# Patient Record
Sex: Female | Born: 1965 | Race: Black or African American | Hispanic: No | Marital: Married | Smoking: Never smoker
Health system: Southern US, Community
[De-identification: ages and names within clinical notes are randomized; demographics above are authoritative.]

## PROBLEM LIST (undated history)

## (undated) DIAGNOSIS — E876 Hypokalemia: Secondary | ICD-10-CM

## (undated) DIAGNOSIS — I10 Essential (primary) hypertension: Secondary | ICD-10-CM

## (undated) HISTORY — PX: BREAST BIOPSY: SHX20

---

## 2017-05-21 ENCOUNTER — Other Ambulatory Visit: Payer: Self-pay | Admitting: Otolaryngology

## 2017-05-21 DIAGNOSIS — K118 Other diseases of salivary glands: Secondary | ICD-10-CM

## 2017-05-30 ENCOUNTER — Encounter: Payer: Self-pay | Admitting: Radiology

## 2017-05-30 ENCOUNTER — Ambulatory Visit
Admission: RE | Admit: 2017-05-30 | Discharge: 2017-05-30 | Disposition: A | Discharge status: Home / Self Care | Payer: 59 | Source: Ambulatory Visit | Attending: Otolaryngology | Admitting: Otolaryngology

## 2017-05-30 DIAGNOSIS — K118 Other diseases of salivary glands: Secondary | ICD-10-CM | POA: Insufficient documentation

## 2017-05-30 DIAGNOSIS — Z8589 Personal history of malignant neoplasm of other organs and systems: Secondary | ICD-10-CM | POA: Diagnosis not present

## 2017-05-30 DIAGNOSIS — R590 Localized enlarged lymph nodes: Secondary | ICD-10-CM | POA: Insufficient documentation

## 2017-05-30 MED ORDER — IOPAMIDOL (ISOVUE-300) INJECTION 61%
75.0000 mL | Freq: Once | INTRAVENOUS | Status: AC | PRN
  Administered 2017-05-30: 75 mL via INTRAVENOUS

## 2017-06-14 ENCOUNTER — Other Ambulatory Visit: Payer: Self-pay | Admitting: Physician Assistant

## 2017-06-14 DIAGNOSIS — Z1231 Encounter for screening mammogram for malignant neoplasm of breast: Secondary | ICD-10-CM

## 2017-07-09 ENCOUNTER — Ambulatory Visit
Admission: RE | Admit: 2017-07-09 | Discharge: 2017-07-09 | Disposition: A | Discharge status: Home / Self Care | Payer: 59 | Source: Ambulatory Visit | Attending: Physician Assistant | Admitting: Physician Assistant

## 2017-07-09 DIAGNOSIS — Z1231 Encounter for screening mammogram for malignant neoplasm of breast: Secondary | ICD-10-CM

## 2017-07-12 ENCOUNTER — Inpatient Hospital Stay
Admission: RE | Admit: 2017-07-12 | Discharge: 2017-07-12 | Disposition: A | Discharge status: Home / Self Care | Payer: Self-pay | Source: Ambulatory Visit | Attending: *Deleted | Admitting: *Deleted

## 2017-07-12 ENCOUNTER — Other Ambulatory Visit: Payer: Self-pay | Admitting: *Deleted

## 2017-07-12 DIAGNOSIS — Z9289 Personal history of other medical treatment: Secondary | ICD-10-CM

## 2017-07-24 ENCOUNTER — Other Ambulatory Visit: Payer: Self-pay | Admitting: Physician Assistant

## 2017-07-24 ENCOUNTER — Ambulatory Visit
Admission: RE | Admit: 2017-07-24 | Discharge: 2017-07-24 | Disposition: A | Discharge status: Home / Self Care | Payer: Self-pay | Source: Ambulatory Visit | Attending: Physician Assistant | Admitting: Physician Assistant

## 2017-07-24 DIAGNOSIS — N63 Unspecified lump in unspecified breast: Secondary | ICD-10-CM

## 2017-07-24 DIAGNOSIS — R9389 Abnormal findings on diagnostic imaging of other specified body structures: Secondary | ICD-10-CM

## 2017-08-09 ENCOUNTER — Other Ambulatory Visit: Payer: Self-pay

## 2017-08-09 DIAGNOSIS — Z1212 Encounter for screening for malignant neoplasm of rectum: Principal | ICD-10-CM

## 2017-08-09 DIAGNOSIS — Z1211 Encounter for screening for malignant neoplasm of colon: Secondary | ICD-10-CM

## 2017-08-09 NOTE — Progress Notes (Signed)
Gastroenterology Pre-Procedure Review  Request Date: 4/26 Requesting Physician: Dr. Maximino Greenlandahiliani  PATIENT REVIEW QUESTIONS: The patient responded to the following health history questions as indicated:    1. Are you having any GI issues? no 2. Do you have a personal history of Polyps? no 3. Do you have a family history of Colon Cancer or Polyps? no 4. Diabetes Mellitus? no 5. Joint replacements in the past 12 months?no 6. Major health problems in the past 3 months?no 7. Any artificial heart valves, MVP, or defibrillator?no    MEDICATIONS & ALLERGIES:    Patient reports the following regarding taking any anticoagulation/antiplatelet therapy:   Plavix, Coumadin, Eliquis, Xarelto, Lovenox, Pradaxa, Brilinta, or Effient? no Aspirin? no  Patient confirms/reports the following medications:  No current outpatient medications on file.   No current facility-administered medications for this visit.     Patient confirms/reports the following allergies:  No Known Allergies  No orders of the defined types were placed in this encounter.   AUTHORIZATION INFORMATION Primary Insurance: 1D#: Group #:  Secondary Insurance: 1D#: Group #:  SCHEDULE INFORMATION: Date: 4/26 Time: Location: [facility]

## 2017-08-12 ENCOUNTER — Telehealth: Payer: Self-pay | Admitting: Gastroenterology

## 2017-08-12 NOTE — Telephone Encounter (Signed)
Gastroenterology Pre-Procedure Review  Request Date:   Requesting Physician: Dr.    PATIENT REVIEW QUESTIONS: The patient responded to the following health history questions as indicated:    1. Are you having any GI issues? no 2. Do you have a personal history of Polyps? no 3. Do you have a family history of Colon Cancer or Polyps? no 4. Diabetes Mellitus? no 5. Joint replacements in the past 12 months?no 6. Major health problems in the past 3 months?no 7. Any artificial heart valves, MVP, or defibrillator?no    MEDICATIONS & ALLERGIES:    Patient reports the following regarding taking any anticoagulation/antiplatelet therapy:   Plavix, Coumadin, Eliquis, Xarelto, Lovenox, Pradaxa, Brilinta, or Effient? no Aspirin? no  Patient confirms/reports the following medications:  No current outpatient medications on file.   No current facility-administered medications for this visit.     Patient confirms/reports the following allergies:  No Known Allergies  No orders of the defined types were placed in this encounter.   AUTHORIZATION INFORMATION Primary Insurance: 1D#: Group #:  Secondary Insurance: 1D#: Group #:  SCHEDULE INFORMATION: Date:  Time: Location:

## 2017-08-14 ENCOUNTER — Ambulatory Visit: Payer: 59

## 2017-08-14 ENCOUNTER — Ambulatory Visit
Admission: RE | Admit: 2017-08-14 | Discharge: 2017-08-14 | Disposition: A | Discharge status: Home / Self Care | Payer: 59 | Source: Ambulatory Visit | Attending: Physician Assistant | Admitting: Physician Assistant

## 2017-08-14 DIAGNOSIS — N6322 Unspecified lump in the left breast, upper inner quadrant: Secondary | ICD-10-CM | POA: Insufficient documentation

## 2017-08-14 DIAGNOSIS — R9389 Abnormal findings on diagnostic imaging of other specified body structures: Secondary | ICD-10-CM

## 2017-08-14 DIAGNOSIS — R921 Mammographic calcification found on diagnostic imaging of breast: Secondary | ICD-10-CM | POA: Diagnosis not present

## 2017-11-19 ENCOUNTER — Telehealth: Payer: Self-pay

## 2017-11-19 NOTE — Telephone Encounter (Signed)
Left message that colonoscopy scheduled on 4/26 with Dr. Maximino Greenlandahiliani will need to reschedule. Doctor not available this day.

## 2017-11-25 ENCOUNTER — Telehealth: Payer: Self-pay | Admitting: Gastroenterology

## 2017-11-25 NOTE — Telephone Encounter (Signed)
Pt left vm to cancel procedure for 04/26 she states she just finished radiation and is not up for the procedure

## 2017-11-29 ENCOUNTER — Encounter: Admission: RE | Payer: Self-pay | Source: Ambulatory Visit

## 2017-11-29 ENCOUNTER — Ambulatory Visit: Admission: RE | Admit: 2017-11-29 | Payer: 59 | Source: Ambulatory Visit | Admitting: Gastroenterology

## 2017-11-29 SURGERY — COLONOSCOPY WITH PROPOFOL
Anesthesia: General

## 2017-11-29 NOTE — Telephone Encounter (Signed)
Pt has canceled procedure on 4/22 via vm because she had just finished radiation and was not up to procedure.

## 2018-02-07 ENCOUNTER — Observation Stay: Payer: 59

## 2018-02-07 ENCOUNTER — Encounter: Payer: Self-pay | Admitting: Emergency Medicine

## 2018-02-07 ENCOUNTER — Other Ambulatory Visit: Payer: Self-pay

## 2018-02-07 ENCOUNTER — Observation Stay
Admission: EM | Admit: 2018-02-07 | Discharge: 2018-02-09 | Disposition: A | Discharge status: Home / Self Care | Payer: 59 | Attending: Internal Medicine | Admitting: Internal Medicine

## 2018-02-07 DIAGNOSIS — E876 Hypokalemia: Principal | ICD-10-CM | POA: Insufficient documentation

## 2018-02-07 DIAGNOSIS — Z923 Personal history of irradiation: Secondary | ICD-10-CM | POA: Insufficient documentation

## 2018-02-07 DIAGNOSIS — E860 Dehydration: Secondary | ICD-10-CM | POA: Diagnosis not present

## 2018-02-07 DIAGNOSIS — R531 Weakness: Secondary | ICD-10-CM | POA: Diagnosis present

## 2018-02-07 DIAGNOSIS — Z8589 Personal history of malignant neoplasm of other organs and systems: Secondary | ICD-10-CM | POA: Insufficient documentation

## 2018-02-07 DIAGNOSIS — Z79899 Other long term (current) drug therapy: Secondary | ICD-10-CM | POA: Diagnosis not present

## 2018-02-07 DIAGNOSIS — G319 Degenerative disease of nervous system, unspecified: Secondary | ICD-10-CM | POA: Insufficient documentation

## 2018-02-07 DIAGNOSIS — R2681 Unsteadiness on feet: Secondary | ICD-10-CM | POA: Insufficient documentation

## 2018-02-07 LAB — BASIC METABOLIC PANEL
Anion gap: 23 — ABNORMAL HIGH (ref 5–15)
BUN: 29 mg/dL — AB (ref 6–20)
CHLORIDE: 90 mmol/L — AB (ref 98–111)
CO2: 30 mmol/L (ref 22–32)
Calcium: 10 mg/dL (ref 8.9–10.3)
Creatinine, Ser: 0.95 mg/dL (ref 0.44–1.00)
GFR calc Af Amer: >60 mL/min (ref >60)
GLUCOSE: 136 mg/dL — AB (ref 70–99)
POTASSIUM: 2.3 mmol/L — AB (ref 3.5–5.1)
Sodium: 143 mmol/L (ref 135–145)

## 2018-02-07 LAB — URINALYSIS, COMPLETE (UACMP) WITH MICROSCOPIC
Bilirubin Urine: NEGATIVE
Glucose, UA: NEGATIVE mg/dL
KETONES UR: 80 mg/dL — AB
Leukocytes, UA: NEGATIVE
Nitrite: NEGATIVE
PROTEIN: 30 mg/dL — AB
Specific Gravity, Urine: 1.027 (ref 1.005–1.030)
pH: 6 (ref 5.0–8.0)

## 2018-02-07 LAB — TROPONIN I: Troponin I: <0.03 ng/mL (ref <0.03)

## 2018-02-07 LAB — CBC
HEMATOCRIT: 45.7 % (ref 35.0–47.0)
Hemoglobin: 15.6 g/dL (ref 12.0–16.0)
MCH: 30.4 pg (ref 26.0–34.0)
MCHC: 34 g/dL (ref 32.0–36.0)
MCV: 89.4 fL (ref 80.0–100.0)
Platelets: 254 10*3/uL (ref 150–440)
RBC: 5.12 MIL/uL (ref 3.80–5.20)
RDW: 15.9 % — AB (ref 11.5–14.5)
WBC: 5.2 10*3/uL (ref 3.6–11.0)

## 2018-02-07 LAB — POTASSIUM
POTASSIUM: 2.7 mmol/L — AB (ref 3.5–5.1)
POTASSIUM: 3.6 mmol/L (ref 3.5–5.1)

## 2018-02-07 LAB — MAGNESIUM: Magnesium: 2.6 mg/dL — ABNORMAL HIGH (ref 1.7–2.4)

## 2018-02-07 LAB — CK: CK TOTAL: 69 U/L (ref 38–234)

## 2018-02-07 LAB — LACTIC ACID, PLASMA
LACTIC ACID, VENOUS: 3.1 mmol/L — AB (ref 0.5–1.9)
Lactic Acid, Venous: 2.2 mmol/L (ref 0.5–1.9)

## 2018-02-07 LAB — TSH: TSH: 0.515 u[IU]/mL (ref 0.350–4.500)

## 2018-02-07 MED ORDER — DOCUSATE SODIUM 100 MG PO CAPS
100.0000 mg | ORAL_CAPSULE | Freq: Two times a day (BID) | ORAL | Status: DC
  Administered 2018-02-08: 08:00:00 100 mg via ORAL
  Filled 2018-02-07 (×3): qty 1

## 2018-02-07 MED ORDER — ONDANSETRON HCL 4 MG PO TABS: 4.0000 mg | ORAL_TABLET | Freq: Four times a day (QID) | ORAL | Status: DC | PRN

## 2018-02-07 MED ORDER — HEPARIN SODIUM (PORCINE) 5000 UNIT/ML IJ SOLN
5000.0000 [IU] | Freq: Three times a day (TID) | INTRAMUSCULAR | Status: DC
  Administered 2018-02-08 – 2018-02-09 (×3): 5000 [IU] via SUBCUTANEOUS
  Filled 2018-02-07 (×4): qty 1

## 2018-02-07 MED ORDER — ONDANSETRON HCL 4 MG/2ML IJ SOLN
4.0000 mg | Freq: Four times a day (QID) | INTRAMUSCULAR | Status: DC | PRN
  Administered 2018-02-07 – 2018-02-08 (×2): 4 mg via INTRAVENOUS
  Filled 2018-02-07 (×2): qty 2

## 2018-02-07 MED ORDER — POTASSIUM CHLORIDE CRYS ER 20 MEQ PO TBCR
60.0000 meq | EXTENDED_RELEASE_TABLET | Freq: Once | ORAL | Status: AC
Start: 2018-02-07 — End: 2018-02-07
  Administered 2018-02-07: 16:00:00 60 meq via ORAL
  Filled 2018-02-07: qty 3

## 2018-02-07 MED ORDER — SODIUM CHLORIDE 0.9 % IV SOLN
INTRAVENOUS | Status: DC
  Administered 2018-02-07 (×2): via INTRAVENOUS

## 2018-02-07 MED ORDER — TRAZODONE HCL 50 MG PO TABS: 25.0000 mg | ORAL_TABLET | Freq: Every evening | ORAL | Status: DC | PRN

## 2018-02-07 MED ORDER — POTASSIUM CHLORIDE CRYS ER 20 MEQ PO TBCR
20.0000 meq | EXTENDED_RELEASE_TABLET | Freq: Once | ORAL | Status: AC
  Administered 2018-02-07: 20 meq via ORAL
  Filled 2018-02-07: qty 1

## 2018-02-07 MED ORDER — POTASSIUM CHLORIDE 20 MEQ PO PACK
60.0000 meq | PACK | Freq: Once | ORAL | Status: AC
  Administered 2018-02-07: 60 meq via ORAL
  Filled 2018-02-07: qty 3

## 2018-02-07 MED ORDER — SODIUM CHLORIDE 0.9 % IV BOLUS
1000.0000 mL | Freq: Once | INTRAVENOUS | Status: AC
  Administered 2018-02-07: 1000 mL via INTRAVENOUS

## 2018-02-07 MED ORDER — POTASSIUM CHLORIDE 10 MEQ/100ML IV SOLN
10.0000 meq | Freq: Once | INTRAVENOUS | Status: AC
  Administered 2018-02-07: 10 meq via INTRAVENOUS
  Filled 2018-02-07: qty 100

## 2018-02-07 MED ORDER — ACETAMINOPHEN 325 MG PO TABS: 650.0000 mg | ORAL_TABLET | Freq: Four times a day (QID) | ORAL | Status: DC | PRN

## 2018-02-07 MED ORDER — ACETAMINOPHEN 650 MG RE SUPP: 650.0000 mg | Freq: Four times a day (QID) | RECTAL | Status: DC | PRN

## 2018-02-07 MED ORDER — HYDROCODONE-ACETAMINOPHEN 5-325 MG PO TABS: 1.0000 | ORAL_TABLET | ORAL | Status: DC | PRN

## 2018-02-07 MED ORDER — BISACODYL 5 MG PO TBEC: 5.0000 mg | DELAYED_RELEASE_TABLET | Freq: Every day | ORAL | Status: DC | PRN

## 2018-02-07 MED ORDER — PIPERACILLIN-TAZOBACTAM 3.375 G IVPB
3.3750 g | Freq: Three times a day (TID) | INTRAVENOUS | Status: DC
  Administered 2018-02-07 – 2018-02-08 (×2): 3.375 g via INTRAVENOUS
  Filled 2018-02-07 (×2): qty 50

## 2018-02-07 NOTE — Progress Notes (Signed)
OT Cancellation Note  Patient Details Name: Robin Perkins MRN: [id number] DOB: 05-Jul-1966   Cancelled Treatment:    Reason Eval/Treat Not Completed: Medical issues which prohibited therapy. Order received, chart reviewed. Pt noted with K+ 2.3. Per therapy guidelines, will hold OT evaluation 2/2 to low K+ and re-attempt at later date/time as pt is medically appropriate.   Richrd PrimeJamie Stiller, MPH, MS, OTR/L ascom [phone] 02/07/18, 4:05 PM

## 2018-02-07 NOTE — ED Provider Notes (Signed)
[facility] Emergency Department Provider Note ____________________________________________   First MD Initiated Contact with Patient 02/07/18 1022     (approximate)  I have reviewed the triage vital signs and the nursing notes.   HISTORY  Chief Complaint Weakness    HPI Robin Perkins is a 52 y.o. 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  She states that she recently was diagnosed with dehydration and constipation and given a laxative which resulted in loose stools.  She does report some recent vomiting as well.  She denies any chest pain or difficulty breathing.  She states she is not taking any medications except for the laxative.   History reviewed. No pertinent past medical history.  Patient Active Problem List   Diagnosis Date Noted  . Hypokalemia 02/07/2018    Past Surgical History:  Procedure Laterality Date  . BREAST BIOPSY Bilateral     Prior to Admission medications   Medication Sig Start Date End Date Taking? Authorizing Provider  mineral oil enema Place 1 enema rectally as directed.   Yes [provider]  polyethylene glycol (MIRALAX / GLYCOLAX) packet Take 17 g by mouth daily.   Yes [provider]    Allergies Patient has no known allergies.  Family History  Problem Relation Age of Onset  . Breast cancer Sister 54    Social History Social History   Tobacco Use  . Smoking status: Never Smoker  . Smokeless tobacco: Never Used  Substance Use Topics  . Alcohol use: Never    Frequency: Never  . Drug use: Never    Review of Systems  Constitutional: Positive for generalized weakness.  No fever. Eyes: No visual changes. ENT: No sore  throat. Cardiovascular: Denies chest pain. Respiratory: Denies shortness of breath. Gastrointestinal: Positive for diarrhea. Genitourinary: Negative for dysuria.  Musculoskeletal: Negative for back pain. Skin: Negative for rash. Neurological: Negative for headache.   ____________________________________________   PHYSICAL EXAM:  VITAL SIGNS: ED Triage Vitals  Enc Vitals Group     BP 02/07/18 0935 (!) 123/97     Pulse Rate 02/07/18 0935 (!) 111     Resp 02/07/18 0935 18     Temp 02/07/18 0935 98 F (36.7 C)     Temp Source 02/07/18 0935 Oral     SpO2 02/07/18 0935 98 %     Weight 02/07/18 0936 140 lb (63.5 kg)     Height 02/07/18 0936 5\' 1"  (1.549 m)     Head Circumference --      Peak Flow --      Pain Score 02/07/18 0946 0     Pain Loc --      Pain Edu? --      Excl. in GC? --     Constitutional: Alert and oriented.  Weak appearing but in no acute distress. Eyes: Conjunctivae are normal.  EOMI.  PERRLA. Head: Atraumatic.  Right facial droop and surgical scars which patient states is chronic and unchanged. Nose: No congestion/rhinnorhea. Mouth/Throat: Mucous membranes are somewhat dry.   Neck: Normal range of motion.  Cardiovascular: Normal rate, regular rhythm. Grossly normal heart sounds.  Good peripheral circulation. Respiratory: Normal respiratory effort.  No retractions. Lungs CTAB. Gastrointestinal: Soft and nontender. No  distention.  Genitourinary: No flank tenderness. Musculoskeletal: No lower extremity edema.  Extremities warm and well perfused.  Neurologic:  Normal speech and language.  Motor intact in all extremities.  Except for chronic facial droop as above, no gross focal neurologic deficits are appreciated.  Skin:  Skin is warm and dry. No rash noted. Psychiatric: Somewhat flat affect.  ____________________________________________   LABS (all labs ordered are listed, but only abnormal results are displayed)  Labs Reviewed  BASIC METABOLIC PANEL -  Abnormal; Notable for the following components:      Result Value   Potassium 2.3 (*)    Chloride 90 (*)    Glucose, Bld 136 (*)    BUN 29 (*)    Anion gap 23 (*)    All other components within normal limits  CBC - Abnormal; Notable for the following components:   RDW 15.9 (*)    All other components within normal limits  URINALYSIS, COMPLETE (UACMP) WITH MICROSCOPIC - Abnormal; Notable for the following components:   Color, Urine AMBER (*)    APPearance HAZY (*)    Hgb urine dipstick SMALL (*)    Ketones, ur 80 (*)    Protein, ur 30 (*)    Bacteria, UA RARE (*)    All other components within normal limits  LACTIC ACID, PLASMA - Abnormal; Notable for the following components:   Lactic Acid, Venous 2.2 (*)    All other components within normal limits  MAGNESIUM - Abnormal; Notable for the following components:   Magnesium 2.6 (*)    All other components within normal limits  CK  TROPONIN I  LACTIC ACID, PLASMA  HIV ANTIBODY (ROUTINE TESTING)  TSH   ____________________________________________  EKG  ED ECG REPORT I, Dionne BucySebastian Theia Dezeeuw, the attending physician, personally viewed and interpreted this ECG.  Date: 02/07/2018 EKG Time: 1104 Rate: 103 Rhythm: Sinus tachycardia QRS Axis: normal Intervals: normal ST/T Wave abnormalities: LVH with inferior and lateral T wave inversions and borderline ST depression Narrative Interpretation: Nonspecific findings; no prior EKG available for comparison  ____________________________________________  RADIOLOGY    ____________________________________________   PROCEDURES  Procedure(s) performed: No  Procedures  Critical Care performed: No ____________________________________________   INITIAL IMPRESSION / ASSESSMENT AND PLAN / ED COURSE  Pertinent labs & imaging results that were available during my care of the patient were reviewed by me and considered in my medical decision making (see chart for  details).  52 year old female with PMH as noted above presents with generalized weakness over approximately the last week, with decreased appetite.  She does report some recent diarrhea after taking a laxative, and states that she was recently diagnosed with dehydration.  She is not on any other medications.  Initial labs reveal significant hypokalemia which likely could explain the patient's symptoms.  Differential also includes dehydration, anemia, rhabdomyolysis, or cardiac etiology.  We will obtain labs to rule out these causes, give fluids and replete potassium and admit.  ----------------------------------------- 3:48 PM on 02/07/2018 -----------------------------------------  The remainder the patient's work-up was relatively unremarkable.  I signed the patient out to the hospitalist Dr. Elisabeth PigeonVachhani. ____________________________________________   FINAL CLINICAL IMPRESSION(S) / ED DIAGNOSES  Final diagnoses:  Hypokalemia  Generalized weakness      NEW MEDICATIONS STARTED DURING THIS VISIT:  Current Discharge Medication List       Note:  This document was prepared using Dragon voice recognition software and may include unintentional dictation errors.    Dionne BucySiadecki, Nieshia Larmon, MD 02/07/18 1549

## 2018-02-07 NOTE — H&P (Signed)
[employer] [facility] [place] [facility]   PATIENT NAME: Robin Perkins    MR#:  [id number]  DATE OF BIRTH:  [birth year]  DATE OF ADMISSION:  02/07/2018  PRIMARY CARE PHYSICIAN: Patient, No Pcp Per   REQUESTING/REFERRING PHYSICIAN: Dionne Bucy, MD  CHIEF COMPLAINT:   Chief Complaint  Patient presents with  . Weakness    HISTORY OF PRESENT ILLNESS:  Robin Perkins  is a 52 y.o. 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 She completed postoperative radiation therapy on 11/20/17. She is followed by [facility] onco. She presents with generalized weakness over approximately the last week, worsening course, and associated with decreased appetite and inability to take care of herself. She was able to walk without any assistance before last week but not any more. She moved to stay with her uncle now.  She states that she recently was diagnosed with dehydration and constipation and given a laxative which resulted in loose stools. She states she is not taking any medications except for the laxative. PAST MEDICAL HISTORY:  History reviewed. No pertinent past medical history. Hepatitis Parotid acinic cell carcinoma PAST SURGICAL HISTORY:   Past Surgical History:  Procedure Laterality Date  . 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 Smoking status: Never Smoker  . Smokeless tobacco: Never Used  Substance Use Topics  . Alcohol use: Not on file    FAMILY HISTORY:   Family History  Problem Relation Age of Onset  . Breast cancer Sister 54    DRUG ALLERGIES:  No Known Allergies  REVIEW OF SYSTEMS:   Review of Systems  Constitutional: Positive for  malaise/fatigue. Negative for chills, fever and weight loss.  HENT: Negative for nosebleeds and sore throat.   Eyes: Negative for blurred vision.  Respiratory: Negative for cough, shortness of breath and wheezing.   Cardiovascular: Negative for chest pain, orthopnea, leg swelling and PND.  Gastrointestinal: Negative for abdominal pain, constipation, diarrhea, heartburn, nausea and vomiting.  Genitourinary: Negative for dysuria and urgency.  Musculoskeletal: Negative for back pain.  Skin: Negative for rash.  Neurological: Negative for dizziness, speech change, focal weakness and headaches.  Endo/Heme/Allergies: Does not bruise/bleed easily.  Psychiatric/Behavioral: Negative for depression.   MEDICATIONS AT HOME:   Prior to Admission medications   Medication Sig Start Date End Date Taking? Authorizing Provider  mineral oil enema Place 1 enema rectally as directed.   Yes [provider]  polyethylene glycol (MIRALAX / GLYCOLAX) packet Take 17 g by mouth daily.   Yes [provider]      VITAL SIGNS:  Blood pressure (!) 129/106, pulse (!) 101, temperature 98 F (36.7 C), temperature source Oral, resp. rate 20, height 5\' 1"  (1.549 m), weight 63.5 kg (140 lb), SpO2 100 %.  PHYSICAL EXAMINATION:  Physical Exam  Constitutional: She is oriented to person, place, and time.  HENT:  Head: Normocephalic and atraumatic.  Right facial droop and surgical scars which patient states is chronic and unchanged  Eyes: Pupils are equal, round, and reactive to light. Conjunctivae and EOM are normal.  Neck: Normal range of motion. Neck supple. No tracheal deviation present. No thyromegaly present.  Cardiovascular: Normal rate, regular rhythm and normal heart sounds.  Pulmonary/Chest: Effort normal and breath sounds  normal. No respiratory distress. She has no wheezes. She exhibits no tenderness.  Abdominal: Soft. Bowel sounds are normal. She exhibits no distension. There is no tenderness.   Musculoskeletal: Normal range of motion.  Neurological: She is alert and oriented to person, place, and time. No cranial nerve deficit.  Skin: Skin is warm and dry. No rash noted.   LABORATORY PANEL:   CBC Recent Labs  Lab 02/07/18 0951  WBC 5.2  HGB 15.6  HCT 45.7  PLT 254   ------------------------------------------------------------------------------------------------------------------  Chemistries  Recent Labs  Lab 02/07/18 0951  NA 143  K 2.3*  CL 90*  CO2 30  GLUCOSE 136*  BUN 29*  CREATININE 0.95  CALCIUM 10.0   ------------------------------------------------------------------------------------------------------------------  Cardiac Enzymes Recent Labs  Lab 02/07/18 1053  TROPONINI <0.03   ------------------------------------------------------------------------------------------------------------------  RADIOLOGY:  No results found.  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 She completed postoperative radiation therapy on 11/20/17. [facility]     All the records are reviewed and case discussed with ED provider. Management plans discussed with the patient, nursing and they are in agreement.  CODE STATUS: FULL CODE  TOTAL TIME TAKING CARE OF THIS PATIENT: 45 minutes.    Delfino LovettVipul Hyder Deman M.D on  02/07/2018 at 1:40 PM  Between 7am to 6pm - Pager - [phone]  After 6pm go to [email or web address] - Social research officer, governmentpassword [email or web address]  [facility] [place] [facility]  Office  [phone]  CC: Primary care physician; Patient, No Pcp Per   Note: This dictation was prepared with Dragon dictation along with smaller phrase technology. Any transcriptional errors that result from this process are unintentional.

## 2018-02-07 NOTE — ED Notes (Signed)
[facility] RN aware of placement in room.

## 2018-02-07 NOTE — ED Triage Notes (Signed)
Patient presents to the ED with difficulty caring for herself x 1 week.  Patient reports eating very little food, taking only a couple of bites of food or a couple of sips of "boost".  Patient's face is obviously asymmetrical.  Patient is unsure of when this started.  Patient states she was told she was dehydrated approx. 3 weeks ago.  Patient has been taking laxatives for constipation.  Patient reports losing a lot of weight.  Patient states, "I've been having difficulty focusing."  Patient states she had radiation in March and April due to carcinoma that was removed from her face in January.

## 2018-02-07 NOTE — ED Notes (Signed)
Date and time results received: 02/07/18 1140   Test: Lactic Acid Critical Value: 2.2  Name of Provider Notified: Dr. Marisa SeverinSiadecki

## 2018-02-07 NOTE — ED Notes (Signed)
Pt has right sided facial drop that pt states is related to removal of carcinoma and radiation, states weight in right eyelid to help keep it closed

## 2018-02-07 NOTE — Progress Notes (Signed)
CRITICAL VALUE ALERT  Critical Value:  Potassium 2.7   Date & Time Notied:  02/07/18@1756     Provider Notified: Dr Sherryll BurgerShah  Orders Received/Actions taken: MD will notify pharmacy.

## 2018-02-07 NOTE — Progress Notes (Signed)
CRITICAL VALUE ALERT  Critical Value:  Lactic acid 3.1  Date & Time Notified:  Paged Dr Sherryll BurgerShah at [phone].    Provider Notified: Dr Sherryll BurgerShah @1640  Orders Received/Actions taken: MD called back and stated he would review chart. No VO over telephone given at this time.

## 2018-02-07 NOTE — Progress Notes (Signed)
Pharmacy Electrolyte Monitoring Consult:  Pharmacy consulted to assist in monitoring and replacing electrolytes in this 52 y.o. female admitted on 02/07/2018 with hypokalemia.   Labs:  Sodium (mmol/L)  Date Value  02/07/2018 143   Potassium (mmol/L)  Date Value  02/07/2018 2.3 (LL)   Magnesium (mg/dL)  Date Value  16/10/960407/12/2017 2.6 (H)   Calcium (mg/dL)  Date Value  54/09/811907/12/2017 10.0    Assessment/Plan: Mg is WNL. K replaced with 30 meq KCl orally and IV so far. Will order an additional KCl 60 meq orally and recheck K.   Robin Perkins, Robin Perkins D 02/07/2018 2:05 PM

## 2018-02-07 NOTE — ED Notes (Signed)
Date and time results received: 02/07/18 (use smartphrase ".now" to insert current time)  Test: potassium  Critical Value: 2.3  Name of Provider Notified:sadecki   MD notified

## 2018-02-07 NOTE — Progress Notes (Signed)
PT Cancellation Note  Patient Details Name: Robin ArnoldShawn Perkins MRN: [id number] DOB: June 29, 1966   Cancelled Treatment:    Reason Eval/Treat Not Completed: Other (comment). Consult received and chart reviewed. Pt currently with 2.3 K+. Will hold at this time and re-attempt next date if pt is medically stable.   Ajai Harville 02/07/2018, 2:54 PM Robin PalauStephanie [name], PT, DPT [phone]

## 2018-02-07 NOTE — Progress Notes (Signed)
Nursing called as lactate trending up - will start empiric IV zosyn (unknown source of infection). 2 sets of blood c/s

## 2018-02-08 LAB — CBC
HCT: 39.4 % (ref 35.0–47.0)
Hemoglobin: 13.3 g/dL (ref 12.0–16.0)
MCH: 30.2 pg (ref 26.0–34.0)
MCHC: 33.7 g/dL (ref 32.0–36.0)
MCV: 89.8 fL (ref 80.0–100.0)
Platelets: 179 10*3/uL (ref 150–440)
RBC: 4.38 MIL/uL (ref 3.80–5.20)
RDW: 16 % — ABNORMAL HIGH (ref 11.5–14.5)
WBC: 3.3 10*3/uL — ABNORMAL LOW (ref 3.6–11.0)

## 2018-02-08 LAB — BASIC METABOLIC PANEL
Anion gap: 12 (ref 5–15)
BUN: 16 mg/dL (ref 6–20)
CO2: 31 mmol/L (ref 22–32)
Calcium: 9 mg/dL (ref 8.9–10.3)
Chloride: 104 mmol/L (ref 98–111)
Creatinine, Ser: 0.75 mg/dL (ref 0.44–1.00)
GFR calc non Af Amer: >60 mL/min (ref >60)
Glucose, Bld: 123 mg/dL — ABNORMAL HIGH (ref 70–99)
POTASSIUM: 3.2 mmol/L — AB (ref 3.5–5.1)
SODIUM: 147 mmol/L — AB (ref 135–145)

## 2018-02-08 LAB — POTASSIUM: POTASSIUM: 3.4 mmol/L — AB (ref 3.5–5.1)

## 2018-02-08 LAB — GLUCOSE, CAPILLARY: Glucose-Capillary: 112 mg/dL — ABNORMAL HIGH (ref 70–99)

## 2018-02-08 LAB — LACTIC ACID, PLASMA: Lactic Acid, Venous: 1.8 mmol/L (ref 0.5–1.9)

## 2018-02-08 MED ORDER — AMLODIPINE BESYLATE 5 MG PO TABS
2.5000 mg | ORAL_TABLET | Freq: Once | ORAL | Status: AC
  Administered 2018-02-09: 2.5 mg via ORAL
  Filled 2018-02-08: qty 1

## 2018-02-08 MED ORDER — POTASSIUM CHLORIDE 20 MEQ PO PACK
20.0000 meq | PACK | ORAL | Status: AC
  Administered 2018-02-08 (×2): 20 meq via ORAL
  Filled 2018-02-08: qty 1

## 2018-02-08 MED ORDER — POTASSIUM CHLORIDE IN NACL 20-0.45 MEQ/L-% IV SOLN
INTRAVENOUS | Status: DC
Start: 2018-02-08 — End: 2018-02-09
  Administered 2018-02-08 – 2018-02-09 (×2): via INTRAVENOUS
  Filled 2018-02-08 (×3): qty 1000

## 2018-02-08 MED ORDER — POTASSIUM CHLORIDE CRYS ER 20 MEQ PO TBCR
20.0000 meq | EXTENDED_RELEASE_TABLET | Freq: Once | ORAL | Status: AC
  Administered 2018-02-08: 20 meq via ORAL
  Filled 2018-02-08: qty 1

## 2018-02-08 NOTE — Progress Notes (Signed)
Pharmacy Electrolyte Monitoring Consult:  Pharmacy consulted to assist in monitoring and replacing electrolytes in this 52 y.o. female admitted on 02/07/2018 with hypokalemia.   Labs:  Sodium (mmol/L)  Date Value  02/08/2018 147 (H)   Potassium (mmol/L)  Date Value  02/08/2018 3.4 (L)   Magnesium (mg/dL)  Date Value  16/10/960407/12/2017 2.6 (H)   Calcium (mg/dL)  Date Value  54/09/811907/01/2018 9.0    Assessment/Plan: 07/06 19:50 K 3.4, still slightly low. Patient received KCl 40mEq po this morning and continues on IVF 0.45% NaCl w/220mEq KCL at 7985ml/hr.  Will order KCl 20mEq po time one dose tonight. Recheck electrolytes with am labs.   Clovia CuffLisa Thedford Bunton, PharmD, BCPS 02/08/2018 8:47 PM

## 2018-02-08 NOTE — Progress Notes (Signed)
Pharmacy Electrolyte Monitoring Consult:  Pharmacy consulted to assist in monitoring and replacing electrolytes in this 52 y.o. female admitted on 02/07/2018 with hypokalemia.   Labs:  Sodium (mmol/L)  Date Value  02/08/2018 147 (H)   Potassium (mmol/L)  Date Value  02/08/2018 3.2 (L)   Magnesium (mg/dL)  Date Value  16/10/960407/12/2017 2.6 (H)   Calcium (mg/dL)  Date Value  54/09/811907/01/2018 9.0    Assessment/Plan: Ordered KCL 20meq PO every 2 hours x 2 doses. Will recheck K tonight at 18:00.   Recheck electrolytes with am labs.   Stormy CardKatsoudas,Jasmaine Rochel K, [facility]fieldRPH Clinical Pharmacist 02/08/2018, 8:23 AM

## 2018-02-08 NOTE — Progress Notes (Signed)
Pt BP increasing throughout day; Latest BP @ 139/101; Primary nurse paged and spoke to DR. Caryn BeeMaier. Orders received to decrease IV fluids to 50 ml an hr. Primary nurse to continue to monitor.

## 2018-02-08 NOTE — Evaluation (Signed)
Occupational Therapy Evaluation Patient Details Name: Robin Perkins MRN: [id number] DOB: 12/14/1965 Today's Date: 02/08/2018    History of Present Illness Pt presents to hospital with complaints of not being able to take care of herself and dehydration. Pt diagnosed with hypokalemia. Pt has a past medical history that includes hepatitis and parotid acinic cell carcinoma.   Clinical Impression   Met pt lying supine in bed, agreeable to OT this date. Pt presents with extreme flat affect, slow to respond or does not respond to some questions asked by OT. Unsure if this is baseline, pt is relatively poor historian when reviewing PT note about vision. Noted pt needing to turn head entirely to visualize objects in environment, but when asked about vision stated she has reading glasses and no other apparent concerns. Supine <> sit completed with CGA- pt endorsing some mild dizziness when asked. Pt completed functional mobility with 2WW to BR to complete toilet t/f with CGA. Toilet hygiene completed with set up A while leaning side/side while seated on toilet. Pt reports having a shower seat in the home and a chair lift to reach her bedroom on the 2nd level. General weakness noted with all functional mobility and BADL engagement. Unable to determine if pt is self limiting or has mild cognitive deficits involving motivation and expressive factor about therapy. When asking pt about baseline function pt states "well it always hasn't been like this okay"- difficult to determine true baseline. Pt reports uncle and daughter in the home occasionally to help out, she has been staying with uncle for the past 2 years. Pt will benefit from [facility] services to increase functional independence in BADL/IADL within the home.    Follow Up Recommendations  Home health OT    Equipment Recommendations  None recommended by OT    Recommendations for Other Services       Precautions / Restrictions Precautions Precautions: Fall       Mobility Bed Mobility Overal bed mobility: Needs Assistance Bed Mobility: Supine to Sit     Supine to sit: Min guard     General bed mobility comments: Pt requires min assist and increased UE support for supine>sit  Transfers Overall transfer level: Needs assistance Equipment used: Rolling walker (2 wheeled) Transfers: Sit to/from Stand Sit to Stand: Min guard         General transfer comment: pt CGA for toilet t/f, no LOB noted, proficient with 2WW    Balance Overall balance assessment: Needs assistance Sitting-balance support: Bilateral upper extremity supported Sitting balance-Leahy Scale: Good Sitting balance - Comments: sits EOB with B feet on floor without UE support and no gross LOB.   Standing balance support: Bilateral upper extremity supported Standing balance-Leahy Scale: Fair Standing balance comment: using 2WW                           ADL either performed or assessed with clinical judgement   ADL Overall ADL's : Needs assistance/impaired Eating/Feeding: Set up   Grooming: Set up   Upper Body Bathing: Sitting;Set up;Supervision/ safety   Lower Body Bathing: Set up;Supervison/ safety;Sit to/from stand   Upper Body Dressing : Set up;Sitting;Supervision/safety   Lower Body Dressing: Sit to/from stand;Supervision/safety;Set up   Toilet Transfer: Min guard;RW   Toileting- Clothing Manipulation and Hygiene: Modified independent   Tub/ Shower Transfer: Supervision/safety;Minimal assistance     General ADL Comments: unclear if pt is self limiting or extremely flat affect with mild cognitive deficits  Vision Baseline Vision/History: Wears glasses Wears Glasses: Reading only Patient Visual Report: No change from baseline Vision Assessment?: Vision impaired- to be further tested in functional context Additional Comments: noted pt turning entire head to visualize objects in environment. When asked about vision pt stated she just  needs reading glasses but otherwise has no deficits (poor historian per PT note)     Perception     Praxis      Pertinent Vitals/Pain Pain Assessment: Faces Faces Pain Scale: Hurts a little bit Pain Location: abdomen- when asked about pain pt rubs stomach and says "I guess a little" Pain Intervention(s): Monitored during session     Hand Dominance     Extremity/Trunk Assessment Upper Extremity Assessment Upper Extremity Assessment: Generalized weakness   Lower Extremity Assessment Lower Extremity Assessment: Generalized weakness       Communication Communication Communication: Other (comment)(slow to respond)   Cognition Arousal/Alertness: Awake/alert Behavior During Therapy: Flat affect Overall Cognitive Status: Difficult to assess                                 General Comments: very flat affect, minimal eye contact, slow to respond. 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 pt demonstrates decreased peripheral vision bilaterally,    Exercises Exercises: Other exercises Other Exercises Other Exercises: Pt instructed in B LE strengthening exercises x10 including SLR, seated and standing marching. Pt able to perform exercises easily however is self limiting and choses to discontinue exercises.   Shoulder Instructions      Home Living Family/patient expects to be discharged to:: Private residence Living Arrangements: Other relatives;Children(uncle) Available Help at Discharge: Family;Available PRN/intermittently Type of Home: House Home Access: Level entry     Home Layout: Two level;Bed/bath upstairs Alternate Level Stairs-Number of Steps: pt has lift chair to 2nd flor   Bathroom Shower/Tub: Tub/shower unit;Walk-in shower         Home Equipment: Gilford Rile - 2 wheels;Walker - 4 wheels;Shower seat          Prior  Functioning/Environment Level of Independence: Independent        Comments: pt states that she was doing things for herself with no AE up until a week ago        OT Problem List: Decreased strength;Decreased activity tolerance      OT Treatment/Interventions: Self-care/ADL training;Therapeutic activities;Energy conservation;Patient/family education    OT Goals(Current goals can be found in the care plan section) Acute Rehab OT Goals Patient Stated Goal: to get back to her independent baseline from more than a week ago OT Goal Formulation: With patient Time For Goal Achievement: 02/22/18 Potential to Achieve Goals: Good  OT Frequency: Min 2X/week   Barriers to D/C:            Co-evaluation              AM-PAC PT "6 Clicks" Daily Activity     Outcome Measure Help from another person eating meals?: None Help from another person taking care of personal grooming?: A Little Help from another person toileting, which includes using toliet, bedpan, or urinal?: A Little Help from another person bathing (including washing, rinsing, drying)?: A Little Help from another person to put on and taking off regular upper body clothing?: A Little Help from another person to put on  and taking off regular lower body clothing?: A Little 6 Click Score: 19   End of Session Equipment Utilized During Treatment: Gait belt;Rolling walker Nurse Communication: Mobility status  Activity Tolerance: Patient tolerated treatment well Patient left: in bed;with call bell/phone within reach;with bed alarm set  OT Visit Diagnosis: Muscle weakness (generalized) (M62.81);Other symptoms and signs involving cognitive function;History of falling (Z91.81)                Time: 8127-5170 OT Time Calculation (min): 18 min Charges:  OT General Charges $OT Visit: 1 Visit OT Treatments $Self Care/Home Management : 8-22 mins G-Codes:     Zenovia Jarred, [place], OTR/L [phone]  Zenovia Jarred 02/08/2018, 12:30  PM

## 2018-02-08 NOTE — Evaluation (Signed)
Physical Therapy Evaluation Patient Details Name: Robin Perkins MRN: [id number] DOB: [birth year] Today's Date: 02/08/2018   History of Present Illness  Pt presents to hospital with complaints of not being able to take care of herself and dehydration. Pt diagnosed with hypokalemia. Pt has a past medical history that includes hepatitis and parotid acinic cell carcinoma.    Clinical Impression  Pt is a pleasant 52 year old female who was admitted for hypokalemia. Pt performs bed mobility, transfers, and ambulation with min assistance. Pt demonstrates deficits with strength, mobility and endurance. Pt performs LE there-ex and amb without outward signs of fatigue such as change in form or increased respirations however states that she can not do more due to fatigue. Pt is very self limiting and PT believes that she would be able to do more than she leads on. Pt states that she does not have any pain at beginning of session. Pt demonstrates flat affect and requires much encouragement to participate in therapy. Pt complains of inability to see or hear on R side. Pt demonstrates decreased peripheral vision bilaterally. Pt could benefit from continued skilled therapy at this time to improve deficits toward PLOF. PT will continue to work with pt at least 2x/week while admitted. D/c recommendations at this time are home with 24/7 supervision and home health PT. This entire session was guided, instructed, and directly supervised by Elizabeth Palau, DPT.       Follow Up Recommendations Home health PT;Supervision/Assistance - 24 hour    Equipment Recommendations  Rolling walker with 5" wheels    Recommendations for Other Services       Precautions / Restrictions Precautions Precautions: None      Mobility  Bed Mobility Overal bed mobility: Needs Assistance Bed Mobility: Supine to Sit     Supine to sit: Min assist     General bed mobility comments: Pt requires min assist and increased UE support for  supine>sit  Transfers Overall transfer level: Needs assistance Equipment used: Rolling walker (2 wheeled) Transfers: Sit to/from Stand Sit to Stand: Min guard         General transfer comment: Pt CGA for sit<>stand. demonstrates safety with transfer with adequate hand placement and no gross LOB.  Ambulation/Gait Ambulation/Gait assistance: Min guard Gait Distance (Feet): 10 Feet Assistive device: Rolling walker (2 wheeled) Gait Pattern/deviations: WFL(Within Functional Limits)     General Gait Details: pt amb 10' with CGA, RW and chair follow. Pt request to sit after 10' stating that she could not ambulate any further despite demonstrating no gait alterations or outward signs of fatigue  Stairs            Wheelchair Mobility    Modified Rankin (Stroke Patients Only)       Balance Overall balance assessment: Needs assistance   Sitting balance-Leahy Scale: Good Sitting balance - Comments: sits EOB with B feet on floor without UE support and no gross LOB.     Standing balance-Leahy Scale: Fair Standing balance comment: Stands with B UE support on RW and no physical assistance. Able to let go of RW to don and doff pants during toiliting without LOB.                             Pertinent Vitals/Pain Pain Assessment: No/denies pain    Home Living Family/patient expects to be discharged to:: Private residence Living Arrangements: Other relatives(uncle) Available Help at Discharge: Family(uncle home "pretty often") Type of  Home: House Home Access: Level entry     Home Layout: Two level Home Equipment: Walker - 4 wheels      Prior Function Level of Independence: Independent with assistive device(s)         Comments: pt states that she was independent without AD one week ago however for the past week has been using a 4ww, relatively inactive past week. Pt has history of falls.     Hand Dominance        Extremity/Trunk Assessment   Upper  Extremity Assessment Upper Extremity Assessment: Generalized weakness    Lower Extremity Assessment Lower Extremity Assessment: Overall WFL for tasks assessed(grossly at least 4/5)       Communication   Communication: Expressive difficulties(speaks in short phrases, difficulty expressing self)  Cognition Arousal/Alertness: Awake/alert Behavior During Therapy: Flat affect Overall Cognitive Status: Difficult to assess                                 General Comments: pt displays flat affect. answers questions in short phrases.      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 pt demonstrates decreased peripheral vision bilaterally,    Exercises Other Exercises Other Exercises: Pt instructed in B LE strengthening exercises x10 including SLR, seated and standing marching. Pt able to perform exercises easily however is self limiting and choses to discontinue exercises.   Assessment/Plan    PT Assessment Patient needs continued PT services  PT Problem List Decreased strength;Decreased range of motion;Decreased activity tolerance;Decreased mobility;Decreased balance;Decreased coordination;Decreased cognition;Decreased knowledge of use of DME;Decreased safety awareness       PT Treatment Interventions DME instruction;Gait training;Stair training;Therapeutic activities;Functional mobility training;Therapeutic exercise;Balance training;Neuromuscular re-education;Patient/family education    PT Goals (Current goals can be found in the Care Plan section)  Acute Rehab PT Goals Patient Stated Goal: pt states that she wants to get back "on track" PT Goal Formulation: With patient Time For Goal Achievement: 02/22/18 Potential to Achieve Goals: Fair    Frequency Min 2X/week   Barriers to discharge        Co-evaluation               AM-PAC PT "6 Clicks" Daily Activity   Outcome Measure Difficulty turning over in bed (including adjusting bedclothes, sheets and blankets)?: A Little Difficulty moving from lying on back to sitting on the side of the bed? : Unable Difficulty sitting down on and standing up from a chair with arms (e.g., wheelchair, bedside commode, etc,.)?: Unable Help needed moving to and from a bed to chair (including a wheelchair)?: A Little Help needed walking in hospital room?: A Little Help needed climbing 3-5 steps with a railing? : A Lot 6 Click Score: 13    End of Session Equipment Utilized During Treatment: Gait belt Activity Tolerance: Patient tolerated treatment well(pt self limiting and could perform more than she leads on.) Patient left: in chair;with call bell/phone within reach;with chair alarm set Nurse Communication: Mobility status PT Visit Diagnosis: Unsteadiness on feet (R26.81);Other abnormalities of gait and mobility (R26.89);Repeated falls (R29.6);Muscle weakness (generalized) (M62.81);History of falling (Z91.81);Difficulty in walking, not elsewhere classified (R26.2)    Time: 9147-8295 PT Time Calculation (min) (ACUTE ONLY): 33 min   Charges:   PT Evaluation $PT Eval Low Complexity: 1 Low PT Treatments $Therapeutic Exercise: 8-22 mins  PT G Codes:        Terrionna Bridwell Elnora MorrisonChaffin, SPT  Zaina Jenkin 02/08/2018, 12:13 PM

## 2018-02-08 NOTE — Plan of Care (Signed)

## 2018-02-08 NOTE — Progress Notes (Signed)
Pt BP continually increasing 146/107; Primary nurse paged and spoke to Dr. Caryn BeeMaier. Orders received for Norvasc 2.5 mg once. Primary  nurse to continue to monitor.

## 2018-02-08 NOTE — Progress Notes (Signed)
Pharmacy Electrolyte Monitoring Consult:  Pharmacy consulted to assist in monitoring and replacing electrolytes in this 52 y.o. female admitted on 02/07/2018 with hypokalemia.   Labs:  Sodium (mmol/L)  Date Value  02/07/2018 143   Potassium (mmol/L)  Date Value  02/07/2018 3.6   Magnesium (mg/dL)  Date Value  16/10/960407/12/2017 2.6 (H)   Calcium (mg/dL)  Date Value  54/09/811907/12/2017 10.0    Assessment/Plan: Mg is WNL. K replaced with 30 meq KCl orally and IV so far. Will order an additional KCl 60 meq orally and recheck K.   07/05 2100 K+ 3.6 WNL. Will hold off on further supplementation and f/u w/ am labs.  Thomasene Rippleavid Kirstie Larsen, PharmD, BCPS Clinical Pharmacist 02/08/2018

## 2018-02-08 NOTE — Progress Notes (Signed)
[facility] - [place] [facility]   PATIENT NAME: Robin Perkins    MR#:  [id number]  DATE OF BIRTH:  01-Jan-1966  SUBJECTIVE:  Came in with generalized weakness in some nausea.  REVIEW OF SYSTEMS:   Review of Systems  Constitutional: Negative for chills, fever and weight loss.  HENT: Negative for ear discharge, ear pain and nosebleeds.   Eyes: Negative for blurred vision, pain and discharge.  Respiratory: Negative for sputum production, shortness of breath, wheezing and stridor.   Cardiovascular: Negative for chest pain, palpitations, orthopnea and PND.  Gastrointestinal: Negative for abdominal pain, diarrhea, nausea and vomiting.  Genitourinary: Negative for frequency and urgency.  Musculoskeletal: Negative for back pain and joint pain.  Neurological: Positive for weakness and headaches. Negative for sensory change, speech change and focal weakness.  Psychiatric/Behavioral: Negative for depression and hallucinations. The patient is not nervous/anxious.    Tolerating Diet: yes Tolerating PT: pending  DRUG ALLERGIES:  No Known Allergies  VITALS:  Blood pressure (!) 121/97, pulse 100, temperature 97.6 F (36.4 C), temperature source Oral, resp. rate 16, height 5\' 1"  (1.549 m), weight 64.2 kg (141 lb 8 oz), SpO2 98 %.  PHYSICAL EXAMINATION:   Physical Exam  GENERAL:  52 y.o.-year-old patient lying in the bed with no acute distress.  EYES: Pupils equal, round, reactive to light and accommodation. No scleral icterus. Extraocular muscles intact.  HEENT: Head atraumatic, normocephalic. Oropharynx and nasopharynx clear.  NECK:  Supple, no jugular venous distention. No thyroid enlargement, no tenderness.  LUNGS: Normal breath sounds bilaterally, no wheezing, rales, rhonchi. No use of accessory muscles of respiration.  CARDIOVASCULAR: S1, S2 normal. No murmurs, rubs, or gallops.  ABDOMEN: Soft, nontender, nondistended. Bowel sounds present. No organomegaly or  mass.  EXTREMITIES: No cyanosis, clubbing or edema b/l.    NEUROLOGIC: Cranial nerves II through XII are intact. No focal Motor or sensory deficits b/l.globally weak   PSYCHIATRIC:  patient is alert and oriented x 3.  SKIN: No obvious rash, lesion, or ulcer.   LABORATORY PANEL:  CBC Recent Labs  Lab 02/08/18 0429  WBC 3.3*  HGB 13.3  HCT 39.4  PLT 179    Chemistries  Recent Labs  Lab 02/07/18 1345  02/08/18 0429  NA  --   --  147*  K  --    < > 3.2*  CL  --   --  104  CO2  --   --  31  GLUCOSE  --   --  123*  BUN  --   --  16  CREATININE  --   --  0.75  CALCIUM  --   --  9.0  MG 2.6*  --   --    < > = values in this interval not displayed.   Cardiac Enzymes Recent Labs  Lab 02/07/18 1053  TROPONINI <0.03   RADIOLOGY:  Ct Head Wo Contrast  Result Date: 02/07/2018 CLINICAL DATA:  Altered mental status. Previous facial carcinoma. Headache. EXAM: CT HEAD WITHOUT CONTRAST TECHNIQUE: Contiguous axial images were obtained from the base of the skull through the vertex without intravenous contrast. COMPARISON:  CT neck May 30, 2017 FINDINGS: Brain: The third and fourth ventricles are normal in size and configuration. Fourth ventricle is mildly enlarged. There is cerebellar atrophy. There is no intracranial mass, hemorrhage, extra-axial fluid collection, or midline shift. Gray-white compartments appear normal. No evident acute infarct. Vascular: No hyperdense vessel. No appreciable vascular calcification. Skull: Postoperative changes noted on  the right with previous mastoidectomy and removal of mass on the right. There is bony thinning throughout the right temporal region. No overt bony destruction evident. Bony calvarium elsewhere appears normal. Sinuses/Orbits: Visualized paranasal sinuses are clear. Visualized orbits appear symmetric bilaterally. Other: Visualized mastoid air cells on the left. Extensive postoperative changes in the right mastoid region with prior mastoidectomy  and tumor removal. IMPRESSION: 1. Extensive postoperative changes in the right mastoid region with evidence of previous mass from this area. Status post mastoidectomy with areas of bony remodeling and erosive change, likely from the previous mass. 2. Cerebellar atrophy and fourth ventricle prominence, potentially due to previous radiation therapy change. 3. Lateral and third ventricles appear normal. No mass or hemorrhage. The gray-white compartments appear normal. Electronically Signed   By: Bretta BangWilliam  Woodruff III M.D.   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 She completed postoperative radiation therapy on 11/20/17. [facility]     Case discussed with Care Management/Social Worker. Management plans discussed with the patient, family and they are in agreement.  CODE STATUS: full  DVT Prophylaxis: lovenox  TOTAL TIME TAKING CARE OF THIS PATIENT: *30* minutes.  >50% time spent on counselling and coordination of care  POSSIBLE D/C IN *1-2 DAYS, DEPENDING ON CLINICAL CONDITION.  Note: This dictation was prepared with Dragon dictation along with smaller phrase technology. Any transcriptional errors that result from this process are unintentional.  Enedina FinnerSona Jaris Kohles M.D on 02/08/2018 at 11:38 AM  Between 7am to 6pm - Pager -  [phone]  After 6pm go to [email or web address] - password [email or web address]  [facility]  Office  [phone]  CC: Primary care physician; Patient, No Pcp PerPatient ID: Robin Perkins, female   DOB: 11-21-1965, 52 y.o.   MRN: [id number]

## 2018-02-09 LAB — MAGNESIUM: MAGNESIUM: 1.7 mg/dL (ref 1.7–2.4)

## 2018-02-09 LAB — POTASSIUM: POTASSIUM: 4.1 mmol/L (ref 3.5–5.1)

## 2018-02-09 LAB — GLUCOSE, CAPILLARY: Glucose-Capillary: 133 mg/dL — ABNORMAL HIGH (ref 70–99)

## 2018-02-09 LAB — BASIC METABOLIC PANEL
ANION GAP: 10 (ref 5–15)
BUN: 7 mg/dL (ref 6–20)
CO2: 29 mmol/L (ref 22–32)
Calcium: 9 mg/dL (ref 8.9–10.3)
Chloride: 100 mmol/L (ref 98–111)
Creatinine, Ser: 0.55 mg/dL (ref 0.44–1.00)
GFR calc Af Amer: >60 mL/min (ref >60)
Glucose, Bld: 108 mg/dL — ABNORMAL HIGH (ref 70–99)
POTASSIUM: 3.2 mmol/L — AB (ref 3.5–5.1)
SODIUM: 139 mmol/L (ref 135–145)

## 2018-02-09 LAB — HIV ANTIBODY (ROUTINE TESTING W REFLEX): HIV Screen 4th Generation wRfx: NONREACTIVE

## 2018-02-09 MED ORDER — AMLODIPINE BESYLATE 5 MG PO TABS: 5.0000 mg | ORAL_TABLET | Freq: Every day | ORAL | 0 refills | Status: AC

## 2018-02-09 MED ORDER — POTASSIUM CHLORIDE CRYS ER 20 MEQ PO TBCR
40.0000 meq | EXTENDED_RELEASE_TABLET | Freq: Once | ORAL | Status: DC
  Filled 2018-02-09: qty 2

## 2018-02-09 MED ORDER — MAGNESIUM SULFATE 2 GM/50ML IV SOLN
2.0000 g | Freq: Once | INTRAVENOUS | Status: AC
  Administered 2018-02-09: 08:00:00 2 g via INTRAVENOUS
  Filled 2018-02-09: qty 50

## 2018-02-09 MED ORDER — POTASSIUM CHLORIDE 10 MEQ/100ML IV SOLN
10.0000 meq | INTRAVENOUS | Status: AC
  Administered 2018-02-09 (×2): 10 meq via INTRAVENOUS
  Filled 2018-02-09: qty 100

## 2018-02-09 MED ORDER — CARVEDILOL 3.125 MG PO TABS
3.1250 mg | ORAL_TABLET | Freq: Once | ORAL | Status: AC
  Administered 2018-02-09: 05:00:00 3.125 mg via ORAL
  Filled 2018-02-09: qty 1

## 2018-02-09 MED ORDER — POTASSIUM CHLORIDE CRYS ER 20 MEQ PO TBCR
40.0000 meq | EXTENDED_RELEASE_TABLET | Freq: Once | ORAL | Status: AC
  Administered 2018-02-09: 10:00:00 40 meq via ORAL
  Filled 2018-02-09: qty 2

## 2018-02-09 MED ORDER — AMLODIPINE BESYLATE 5 MG PO TABS
5.0000 mg | ORAL_TABLET | Freq: Every day | ORAL | Status: DC
  Administered 2018-02-09: 08:00:00 5 mg via ORAL
  Filled 2018-02-09 (×2): qty 1

## 2018-02-09 MED ORDER — ENSURE ENLIVE PO LIQD: 237.0000 mL | Freq: Two times a day (BID) | ORAL | Status: DC

## 2018-02-09 NOTE — Progress Notes (Signed)
Pt BP elevated 131/103; primary nurse paged and spoke to Dr. Marjie SkiffSridharan. Orders received for Coreg. Primary nurse to continue to monitor.

## 2018-02-09 NOTE — Progress Notes (Addendum)
Pharmacy Electrolyte Monitoring Consult:  Pharmacy consulted to assist in monitoring and replacing electrolytes in this 52 y.o. female admitted on 02/07/2018 with hypokalemia.   Labs:  Sodium (mmol/L)  Date Value  02/09/2018 139   Potassium (mmol/L)  Date Value  02/09/2018 3.2 (L)   Magnesium (mg/dL)  Date Value  40/98/119107/02/2018 1.7   Calcium (mg/dL)  Date Value  47/82/956207/02/2018 9.0    Assessment/Plan: Per MD recommendation, ordered KCL 40meq PO x 1 dose in addition to KCL 20meq IV x 1 dose.  Mag 2g IV has been given. Also on IVF 0.45% NaCl w/2720mEq KCL at 1385ml/hr.  Recheck K today at 15:00.  Report to Dr. Allena KatzPatel. Would like to D/C patient as soon as K is corrected.  Stormy CardKatsoudas,Cortnee Steinmiller K, [facility] 02/09/2018 7:44 AM

## 2018-02-09 NOTE — Progress Notes (Signed)
Patient discharged home with uncle, followed by home health. Patient verbalized understanding of education. Patient with no complaints.

## 2018-02-09 NOTE — Discharge Summary (Signed)
[facility] [place] [facility]   PATIENT NAME: Robin ArnoldShawn Perkins    MR#:  [id number]  DATE OF BIRTH:  May 04, 1966  DATE OF ADMISSION:  02/07/2018 ADMITTING PHYSICIAN: Delfino LovettVipul Shah, MD  DATE OF DISCHARGE: 02/09/2018  PRIMARY CARE PHYSICIAN: Patient, No Pcp Per    ADMISSION DIAGNOSIS:  Hypokalemia [E87.6] Generalized weakness [R53.1]  DISCHARGE DIAGNOSIS:   Hypokalemia [E87.6] Generalized weakness [R53.1] SECONDARY DIAGNOSIS:  History reviewed. No pertinent past medical history.  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 She completed postoperative radiation therapy on 11/20/17. [facility] - [facility]  Overall better D/c home with HHPT  CONSULTS OBTAINED:    DRUG ALLERGIES:  No Known Allergies  DISCHARGE MEDICATIONS:   Allergies as of 02/09/2018   No Known Allergies     Medication List    STOP taking these medications   mineral oil enema   polyethylene glycol packet Commonly known as:  MIRALAX / GLYCOLAX     TAKE these medications   amLODipine 5 MG tablet Commonly known as:  NORVASC Take 1 tablet (5 mg total) by mouth daily. Start taking on:  02/10/2018       If you experience worsening of your admission symptoms, develop shortness of breath, life threatening emergency, suicidal or homicidal thoughts you must seek medical  attention immediately by calling 911 or calling your MD immediately  if symptoms less severe.  You Must read complete instructions/literature along with all the possible adverse reactions/side effects for all the Medicines you take and that have been prescribed to you. Take any new Medicines after you have completely understood and accept all the possible adverse reactions/side effects.   Please note  You were cared for by a hospitalist during your hospital stay. If you have any questions about your discharge medications or the care you received while you were in the hospital after you are discharged, you can call the unit and asked to speak with the hospitalist on call if the hospitalist that took care of you is not available. Once you are discharged, your primary care physician will handle any further medical issues. Please note that NO REFILLS for any discharge medications will be authorized once you are discharged, as it is imperative that you return to your primary care physician (or establish a relationship with a primary care physician if you do not have one) for your aftercare needs so that they can reassess your need for medications and monitor your lab values. Today   SUBJECTIVE   No new complaints  VITAL SIGNS:  Blood pressure (!) 119/94, pulse 95, temperature 98.2 F (36.8 C), temperature source Oral, resp. rate 18, height 5\' 1"  (1.549 m), weight 64.2 kg (141 lb 8 oz), SpO2 98 %.  I/O:    Intake/Output Summary (Last 24 hours) at 02/09/2018 1536 Last data filed at 02/09/2018 1007 Gross per 24 hour  Intake 1414.03 ml  Output -  Net  1414.03 ml    PHYSICAL EXAMINATION:  GENERAL:  52 y.o.-year-old patient lying in the bed with no acute distress.  EYES: Pupils equal, round, reactive to light and accommodation. No scleral icterus. Extraocular muscles intact.  HEENT: Head atraumatic, normocephalic. Oropharynx and nasopharynx clear.  NECK:  Supple, no jugular venous distention. No  thyroid enlargement, no tenderness.  LUNGS: Normal breath sounds bilaterally, no wheezing, rales,rhonchi or crepitation. No use of accessory muscles of respiration.  CARDIOVASCULAR: S1, S2 normal. No murmurs, rubs, or gallops.  ABDOMEN: Soft, non-tender, non-distended. Bowel sounds present. No organomegaly or mass.  EXTREMITIES: No pedal edema, cyanosis, or clubbing.  NEUROLOGIC: Cranial nerves II through XII are intact. Muscle strength 5/5 in all extremities. Sensation intact. Gait not checked.  PSYCHIATRIC: The patient is alert and oriented x 3.  SKIN: No obvious rash, lesion, or ulcer.   DATA REVIEW:   CBC  Recent Labs  Lab 02/08/18 0429  WBC 3.3*  HGB 13.3  HCT 39.4  PLT 179    Chemistries  Recent Labs  Lab 02/09/18 0400 02/09/18 1452  NA 139  --   K 3.2* 4.1  CL 100  --   CO2 29  --   GLUCOSE 108*  --   BUN 7  --   CREATININE 0.55  --   CALCIUM 9.0  --   MG 1.7  --     Microbiology Results   Recent Results (from the past 240 hour(s))  CULTURE, BLOOD (ROUTINE X 2) w Reflex to ID Panel     Status: None (Preliminary result)   Collection Time: 02/07/18  5:27 PM  Result Value Ref Range Status   Specimen Description BLOOD RIGHT ANTECUBITAL  Final   Special Requests   Final    BOTTLES DRAWN AEROBIC AND ANAEROBIC Blood Culture adequate volume   Culture   Final    NO GROWTH 2 DAYS Performed at [facility], [address], [place], [place] [address]    Report Status PENDING  Incomplete  CULTURE, BLOOD (ROUTINE X 2) w Reflex to ID Panel     Status: None (Preliminary result)   Collection Time: 02/07/18  6:13 PM  Result Value Ref Range Status   Specimen Description BLOOD BLOOD LEFT HAND  Final   Special Requests   Final    BOTTLES DRAWN AEROBIC AND ANAEROBIC Blood Culture adequate volume   Culture   Final    NO GROWTH 2 DAYS Performed at [facility], [address], [place], [place] [address]    Report Status PENDING  Incomplete     RADIOLOGY:  No results found.   Management plans discussed with the patient, family and they are in agreement.  CODE STATUS:     Code Status Orders  (From admission, onward)        Start     Ordered   02/07/18 1455  Full code  Continuous     02/07/18 1454    Code Status History    This patient has a current code status but no historical code status.      TOTAL TIME TAKING CARE OF THIS PATIENT: *40* minutes.    Enedina Finner M.D on 02/09/2018 at 3:36 PM  Between 7am to 6pm - Pager - [phone] After 6pm go to [email or web address] - password [email or web address]  [facility] [place] [facility]  Office  [phone]  CC: Primary care physician; Patient, No Pcp Per

## 2018-02-09 NOTE — Progress Notes (Signed)
Initial Nutrition Assessment  DOCUMENTATION CODES:   Not applicable  INTERVENTION:  Recommend liberalizing diet to regular.  Provide Ensure Enlive po BID, each supplement provides 350 kcal and 20 grams of protein. Patient prefers vanilla.  NUTRITION DIAGNOSIS:   Inadequate oral intake related to decreased appetite as evidenced by per patient/family report, 6.1 percent weight loss over 3 weeks.  GOAL:   Patient will meet greater than or equal to 90% of their needs  MONITOR:   PO intake, Supplement acceptance, Labs, Weight trends, I & O's  REASON FOR ASSESSMENT:   Malnutrition Screening Tool    ASSESSMENT:   52 year old female with PMHx of hepatitis and right parotic acinic cell carcinoma s/p resection and XRT admitted with weakness, severe hypokalemia, headaches.   -Patient now with order to discharge home with HHPT.  Met with patient at bedside. She reports her appetite was decreased for 3 weeks PTA. She is unsure what caused her decreased appetite. She is unable to provide any details on intake PTA. She reports her appetite and intake have improved here and she is feeling better now. She denies any difficulty chewing/swallowing.  Patient reports UBW was 150 lbs. Patient was 150.7 lbs on 01/16/2018. She has lost 9.2 lbs (6.1% body weight) over the past 3 weeks, which is significant for time frame.  Medications reviewed and include: Colace.  Labs reviewed: Potassium now 4.1 after supplementation.  Patient does not meet criteria for malnutrition at this time but is at risk for malnutrition.  NUTRITION - FOCUSED PHYSICAL EXAM:    Most Recent Value  Orbital Region  No depletion  Upper Arm Region  No depletion  Thoracic and Lumbar Region  No depletion  Buccal Region  No depletion  Temple Region  No depletion  Clavicle Bone Region  No depletion  Clavicle and Acromion Bone Region  No depletion  Scapular Bone Region  No depletion  Dorsal Hand  No depletion  Patellar  Region  No depletion  Anterior Thigh Region  No depletion  Posterior Calf Region  No depletion  Edema (RD Assessment)  None  Hair  Reviewed  Eyes  Reviewed  Mouth  Reviewed  Skin  Reviewed  Nails  Reviewed     Diet Order:   Diet Order           Diet - low sodium heart healthy        Diet Heart Room service appropriate? Yes; Fluid consistency: Thin  Diet effective now          EDUCATION NEEDS:   Not appropriate for education at this time  Skin:  Skin Assessment: Reviewed RN Assessment  Last BM:  02/08/2018  Height:   Ht Readings from Last 1 Encounters:  02/07/18 5' 1"  (1.549 m)    Weight:   Wt Readings from Last 1 Encounters:  02/08/18 141 lb 8 oz (64.2 kg)    Ideal Body Weight:  47.7 kg  BMI:  Body mass index is 26.74 kg/m.  Estimated Nutritional Needs:   Kcal:  1600-1930 (25-30 kcal/kg)  Protein:  80-95 grams (1.2-1.5 grams/kg)  Fluid:  1.6-1.9 L/day (25-30 kcal/kg)  Willey Blade, MS, RD, LDN Office: [phone] Pager: [phone] After Hours/Weekend Pager: [phone]

## 2018-02-09 NOTE — Discharge Instructions (Signed)
Nutrition Post Hospital Stay °Proper nutrition can help your body recover from illness and injury.   °Foods and beverages high in protein, vitamins, and minerals help rebuild muscle loss, promote healing, & reduce fall risk.  ° °•In addition to eating healthy foods, a nutrition shake is an easy, delicious way to get the nutrition you need during and after your hospital stay ° °It is recommended that you continue to drink 2 bottles per day of:       Ensure Enlive ° °Tips for adding a nutrition shake into your routine: °As allowed, drink one with vitamins or medications instead of water or juice °Enjoy one as a tasty mid-morning or afternoon snack °Drink cold or make a milkshake out of it °Drink one instead of milk with cereal or snacks °Use as a coffee creamer °  °Available at the following grocery stores and pharmacies:           °* [employer] * [employer] * [employer]co  °* Rite Aid          * Walmart * [employer]  °* Walgreens      * Target  * BJ's   °* CVS  * [employer] Foods   °* [facility] [phone]  °          °For COUPONS visit: www.ensure.com/join or www.boost.com/members/sign-up  ° °Suggested Substitutions °Ensure Plus = Boost Plus = Carnation Breakfast Essentials = Boost Compact °Ensure Active Clear = Boost Breeze °Glucerna Shake = Boost Glucose Control = Carnation Breakfast Essentials SUGAR FREE ° °  ° °

## 2018-02-09 NOTE — Care Management Note (Signed)
Case Management Note  Patient Details  Name: Robin Perkins MRN: [id number] Date of Birth: 12/18/1965  Subjective/Objective:   Patient to be discharged per MD order. Orders in place for Home health services. Given choice and patient would prefer advanced home care. Referral placed with [facility] for PT services. No DME necessary. No issues obtaining medications. Family to provide transport.  Buddy DutyJosh Barlow Harrison RN BSN RNCM [phone]                  Action/Plan:   Expected Discharge Date:  02/09/18               Expected Discharge Plan:     In-House Referral:     Discharge planning Services  CM Consult  Post Acute Care Choice:  Home Health Choice offered to:     DME Arranged:    DME Agency:     HH Arranged:  PT HH Agency:  [facility]  Status of Service:  Completed, signed off  If discussed at Long Length of Stay Meetings, dates discussed:    Additional Comments:  Virgel ManifoldJosh A Elvyn Krohn, RN 02/09/2018, 4:03 PM

## 2018-02-12 LAB — CULTURE, BLOOD (ROUTINE X 2)
CULTURE: NO GROWTH
Culture: NO GROWTH
SPECIAL REQUESTS: ADEQUATE
Special Requests: ADEQUATE

## 2018-02-20 ENCOUNTER — Other Ambulatory Visit: Payer: Self-pay

## 2018-02-20 ENCOUNTER — Emergency Department
Admission: EM | Admit: 2018-02-20 | Discharge: 2018-02-20 | Disposition: A | Discharge status: Skilled Nursing Facility | Payer: 59 | Attending: Emergency Medicine | Admitting: Emergency Medicine

## 2018-02-20 DIAGNOSIS — I10 Essential (primary) hypertension: Secondary | ICD-10-CM | POA: Insufficient documentation

## 2018-02-20 DIAGNOSIS — R945 Abnormal results of liver function studies: Secondary | ICD-10-CM | POA: Diagnosis not present

## 2018-02-20 DIAGNOSIS — R000 Tachycardia, unspecified: Secondary | ICD-10-CM | POA: Insufficient documentation

## 2018-02-20 DIAGNOSIS — M79671 Pain in right foot: Secondary | ICD-10-CM | POA: Insufficient documentation

## 2018-02-20 DIAGNOSIS — R7989 Other specified abnormal findings of blood chemistry: Secondary | ICD-10-CM

## 2018-02-20 DIAGNOSIS — M79672 Pain in left foot: Secondary | ICD-10-CM | POA: Diagnosis not present

## 2018-02-20 DIAGNOSIS — R531 Weakness: Secondary | ICD-10-CM | POA: Diagnosis not present

## 2018-02-20 HISTORY — DX: Hypomagnesemia: E83.42

## 2018-02-20 HISTORY — DX: Essential (primary) hypertension: I10

## 2018-02-20 HISTORY — DX: Hypokalemia: E87.6

## 2018-02-20 LAB — URINALYSIS, COMPLETE (UACMP) WITH MICROSCOPIC
Bacteria, UA: NONE SEEN
Bilirubin Urine: NEGATIVE
Glucose, UA: NEGATIVE mg/dL
Hgb urine dipstick: NEGATIVE
KETONES UR: NEGATIVE mg/dL
LEUKOCYTES UA: NEGATIVE
Nitrite: NEGATIVE
PH: 9 — AB (ref 5.0–8.0)
PROTEIN: NEGATIVE mg/dL
Specific Gravity, Urine: 1.012 (ref 1.005–1.030)

## 2018-02-20 LAB — COMPREHENSIVE METABOLIC PANEL
ALK PHOS: 59 U/L (ref 38–126)
ALT: 146 U/L — AB (ref 0–44)
ANION GAP: 6 (ref 5–15)
AST: 64 U/L — AB (ref 15–41)
Albumin: 3.9 g/dL (ref 3.5–5.0)
BILIRUBIN TOTAL: 0.8 mg/dL (ref 0.3–1.2)
BUN: 9 mg/dL (ref 6–20)
CALCIUM: 9.9 mg/dL (ref 8.9–10.3)
CO2: 27 mmol/L (ref 22–32)
CREATININE: 0.58 mg/dL (ref 0.44–1.00)
Chloride: 107 mmol/L (ref 98–111)
GFR calc Af Amer: >60 mL/min (ref >60)
GFR calc non Af Amer: >60 mL/min (ref >60)
Glucose, Bld: 112 mg/dL — ABNORMAL HIGH (ref 70–99)
Potassium: 3.8 mmol/L (ref 3.5–5.1)
SODIUM: 140 mmol/L (ref 135–145)
Total Protein: 7.3 g/dL (ref 6.5–8.1)

## 2018-02-20 LAB — CBC
HEMATOCRIT: 38.2 % (ref 35.0–47.0)
HEMOGLOBIN: 13.1 g/dL (ref 12.0–16.0)
MCH: 31 pg (ref 26.0–34.0)
MCHC: 34.3 g/dL (ref 32.0–36.0)
MCV: 90.3 fL (ref 80.0–100.0)
Platelets: 273 10*3/uL (ref 150–440)
RBC: 4.23 MIL/uL (ref 3.80–5.20)
RDW: 16.8 % — ABNORMAL HIGH (ref 11.5–14.5)
WBC: 4.9 10*3/uL (ref 3.6–11.0)

## 2018-02-20 LAB — TSH: TSH: 2.739 u[IU]/mL (ref 0.350–4.500)

## 2018-02-20 LAB — MAGNESIUM: Magnesium: 2.2 mg/dL (ref 1.7–2.4)

## 2018-02-20 MED ORDER — FENTANYL CITRATE (PF) 100 MCG/2ML IJ SOLN
50.0000 ug | Freq: Once | INTRAMUSCULAR | Status: AC
  Administered 2018-02-20: 50 ug via INTRAVENOUS
  Filled 2018-02-20: qty 2

## 2018-02-20 MED ORDER — SODIUM CHLORIDE 0.9 % IV BOLUS
1000.0000 mL | Freq: Once | INTRAVENOUS | Status: AC
  Administered 2018-02-20: 1000 mL via INTRAVENOUS

## 2018-02-20 NOTE — ED Provider Notes (Signed)
Signout from Dr. Sharma CovertNorman in this 52 year old female with a history of parotid cancer requiring placement by social work.  Pending placement at this time.  Physical Exam  BP (!) 117/93   Pulse (!) 103   Temp 97.9 F (36.6 C) (Oral)   Resp 19   Ht 5\' 1"  (1.549 m)   Wt 63.5 kg (140 lb)   SpO2 99%   BMI 26.45 kg/m  ----------------------------------------- 5:32 PM on 02/20/2018 -----------------------------------------   Physical Exam Patient without any distress.  Appears calm ED Course/Procedures     Procedures  MDM  Patient able to be placed today through social work at a skilled nursing facility.  Patient will be discharged at this time.       Myrna BlazerSchaevitz, Boluwatife Mutchler Matthew, MD 02/20/18 [phone]

## 2018-02-20 NOTE — ED Notes (Signed)
ED Provider at bedside. 

## 2018-02-20 NOTE — Clinical Social Work Note (Signed)
CSW consulted for facility placement. Patient currently lives with her uncle and he is unable to care for her due to increased care needs. CSW [place]. CSW will follow for discharge planning.   Ruthe Mannanandace Malky Rudzinski MSW, [address] [phone]

## 2018-02-20 NOTE — Clinical Social Work Note (Signed)
CSW got a bed offer from [facility] for patient and [facility]. CSW gave bed offer to patient and uncle in room. Both patient and uncle have accepted bed offer and plan for [facility] to [facility]. [facility] [place] [facility] and accepted bed. RN will call report and call for transport.   Ruthe Mannanandace Lyrik Buresh MSW, [address] [phone]

## 2018-02-20 NOTE — ED Notes (Addendum)
Pt requesting pain meds for pain in the tips of her toes. Pain began in the first of July. Pt denies taking pain meds at home. MD notified.

## 2018-02-20 NOTE — ED Notes (Signed)
Pt enroute to [facility]. Attempted to give report. RN states she doesn't need report as she has paperwork. Notified her that pt is on the way.

## 2018-02-20 NOTE — ED Notes (Addendum)
Pt has been accepted to [facility].

## 2018-02-20 NOTE — ED Provider Notes (Addendum)
[facility] Emergency Department Provider Note  ____________________________________________  Time seen: Approximately 12:45 PM  I have reviewed the triage vital signs and the nursing notes.   HISTORY  Chief Complaint Altered Mental Status    HPI Robin Perkins is a 52 y.o. 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  The patient reports that for the past week, she has become less and less mobile and is now no longer able to ambulate or perform any of her ADLs.  She also reports chronically worsening bilateral foot pain, worse on the right.  No trauma. She has in-home physical therapy, home health, and an uncle who helps her to use the bathroom, but she is unable to take care of herself anymore.  She denies any changes in her p.o. intake, fevers or chills, urinary symptoms, nausea vomiting or diarrhea.  No dysuria or urinary frequency.    I reviewed the patient's medical chart and she  was discharged 02/09/2018 after an admission for hypokalemia.  At that time, she did undergo CT imaging which did not reveal any metastatic disease.  Past Medical History:  Diagnosis Date  . Hypertension   . Hypokalemia   . Hypomagnesemia     Patient Active Problem List   Diagnosis Date Noted  . Hypokalemia 02/07/2018    Past Surgical History:  Procedure Laterality Date  . BREAST BIOPSY Bilateral     Current Outpatient Rx  . Order #: [id number] Class: Normal    Allergies Patient has no known allergies.  Family History  Problem Relation Age of Onset  . Breast cancer Sister 37    Social History Social History   Tobacco Use  . Smoking status: Never Smoker  . Smokeless tobacco: Never Used  Substance Use Topics  . Alcohol use: Never    Frequency: Never  . Drug use: Never    Review of Systems Constitutional: No fever/chills. + progressive weakness.  No  lightheadedness or syncope. Eyes: No visual changes. ENT: No sore throat. No congestion or rhinorrhea.  Some difficulty swallowing which is unchanged.  Reports a tight sensation around her right face. Cardiovascular: Denies chest pain. Denies palpitations. Respiratory: Denies shortness of breath.  No cough. Gastrointestinal: No abdominal pain.  No nausea, no vomiting.  No diarrhea.  No constipation. Genitourinary: Negative for dysuria.  No urinary frequency. Musculoskeletal: Negative for back pain. Skin: Negative for rash. Neurological: Negative for headaches. No focal numbness, tingling or weakness.     ____________________________________________   PHYSICAL EXAM:  VITAL SIGNS: ED Triage Vitals  Enc Vitals Group     BP 02/20/18 1228 103/88     Pulse Rate 02/20/18 1228 (!) 113     Resp 02/20/18 1228 18     Temp 02/20/18 1244 97.9 F (36.6 C)     Temp Source 02/20/18 1244 Oral     SpO2 02/20/18 1228 99 %     Weight 02/20/18 1228 140 lb (63.5 kg)     Height 02/20/18 1228 5\' 1"  (1.549 m)     Head Circumference --      Peak Flow --      Pain Score 02/20/18 1228 0     Pain Loc --      Pain Edu? --      Excl. in GC? --     Constitutional: Alert and oriented. Answers questions appropriately. Eyes: Conjunctivae are normal.  EOMI. No scleral icterus. Head: Old surgical incision  R neck. R facial paralysis and asymmetry.  No evidence of swelling, erythema, fluctuance. Nose: No congestion/rhinnorhea. Mouth/Throat: Mucous membranes are mildly dry.  Neck: No stridor.  Supple.  No JVD.  No meningismus. Cardiovascular: Fast rate, regular rhythm. No murmurs, rubs or gallops.  Respiratory: Normal respiratory effort.  No accessory muscle use or retractions. Lungs CTAB.  No wheezes, rales or ronchi. Gastrointestinal: Soft, nontender and nondistended.  No guarding or rebound.  No peritoneal signs. Musculoskeletal: No LE edema.  The patient has sensitivity to light touch in the right foot  without any evidence of swelling or trauma.  She has normal DP and PT pulses bilaterally.  Her motor strength is 4 out of 5 in the bilateral lower extremities. Neurologic:  A&Ox3.  Speech is clear.  Decreased strength in her lower extremities as described above. Skin:  Skin is warm, dry and intact. No rash noted. Psychiatric: Mood and affect are normal. ____________________________________________   LABS (all labs ordered are listed, but only abnormal results are displayed)  Labs Reviewed  CBC - Abnormal; Notable for the following components:      Result Value   RDW 16.8 (*)    All other components within normal limits  COMPREHENSIVE METABOLIC PANEL - Abnormal; Notable for the following components:   Glucose, Bld 112 (*)    AST 64 (*)    ALT 146 (*)    All other components within normal limits  URINALYSIS, COMPLETE (UACMP) WITH MICROSCOPIC - Abnormal; Notable for the following components:   Color, Urine YELLOW (*)    APPearance CLOUDY (*)    pH 9.0 (*)    All other components within normal limits  MAGNESIUM  TSH   ____________________________________________  EKG  ED ECG REPORT I, Rockne MenghiniNorman, Anne-Caroline, the attending physician, personally viewed and interpreted this ECG.   Date: 02/20/2018  EKG Time: 1242  Rate: 109  Rhythm: sinus tachycardia  Axis: normal  Intervals:prolonged QTc  ST&T Change: No STEMI; poor baseline tracing  ____________________________________________  RADIOLOGY  No results found.  ____________________________________________   PROCEDURES  Procedure(s) performed: None  Procedures  Critical Care performed: No ____________________________________________   INITIAL IMPRESSION / ASSESSMENT AND PLAN / ED COURSE  Pertinent labs & imaging results that were available during my care of the patient were reviewed by me and considered in my medical decision making (see chart for details).  52 y.o. female with a history of parotid cancer status  post resection and radiation not currently under treatment presenting for progressive weakness for the past week.  Overall, the patient is tachycardic and has dry mucous membranes; I am concerned about dehydration and will treat the patient with intravenous fluids.  Orthostatics are pending.  We will also evaluate for electrolyte disturbance, anemia, UTI.  Given the patient's symptoms, metastatic illness is less likely and she just recently had a CT scan 11 days ago which did not show any metastatic disease.  Plan reevaluation for final disposition.  ----------------------------------------- 2:23 PM on 02/20/2018 -----------------------------------------  The patient's tachycardia has resolved with intravenous fluids.  Her laboratory studies are reassuring and today her potassium has normalized.  Her urinalysis does not show infection.  She is not anemic.  The patient does have an abnormal ALT and AST; I have reviewed her medical chart we do not have any prior levels here.  She does have some [facility] which I found when reviewing her chart in care everywhere, which were normal on prior evaluation.  The patient has a  normal bilirubin and no abdominal pain so this is an abnormality which will need to be monitored in the outpatient setting.  At this time, the patient does not meet criteria for inpatient admission although her magnesium is still pending.  We will proceed with social work consult for rehab or skilled nursing facility placement.  ____________________________________________  FINAL CLINICAL IMPRESSION(S) / ED DIAGNOSES  Final diagnoses:  Abnormal LFTs  Generalized weakness  Sinus tachycardia  Bilateral foot pain         NEW MEDICATIONS STARTED DURING THIS VISIT:  New Prescriptions   No medications on file      Rockne Menghini, MD 02/20/18 1319    Rockne Menghini, MD 02/20/18 1426

## 2018-02-20 NOTE — [place] FL2 (Addendum)
  [place] MEDICAID [place]2 LEVEL OF CARE SCREENING TOOL     IDENTIFICATION  Patient Name: Robin Perkins Birthdate: 1966-03-15 Sex: female Admission Date (Current Location): 02/20/2018  [place]   Facility and Address:  [facility], [address], [place], [place] [address]      Provider Number: [id number]  Attending Physician Name and Address:  No att. providers found  Relative Name and Phone Number:  Robin Perkins, Spouse [phone]    Current Level of Care: Hospital Recommended Level of Care: Skilled Nursing Facility Prior Approval Number:    Date Approved/Denied:   PASRR Number: [id number]  Discharge Plan: SNF    Current Diagnoses: Patient Active Problem List   Diagnosis Date Noted  . Hypokalemia 02/07/2018    Orientation RESPIRATION BLADDER Height & Weight     Self, Time, Place  Normal Incontinent Weight: 140 lb (63.5 kg) Height:  5\' 1"  (154.9 cm)  BEHAVIORAL SYMPTOMS/MOOD NEUROLOGICAL BOWEL NUTRITION STATUS  (none) (none) Continent Diet(Regular)  AMBULATORY STATUS COMMUNICATION OF NEEDS Skin   Extensive Assist Verbally Normal                       Personal Care Assistance Level of Assistance  Bathing, Feeding, Dressing Bathing Assistance: Limited assistance Feeding assistance: Independent Dressing Assistance: Limited assistance     Functional Limitations Info  Sight, Hearing, Speech Sight Info: Adequate Hearing Info: Impaired Speech Info: Adequate    SPECIAL CARE FACTORS FREQUENCY  PT (By licensed PT), OT (By licensed OT)     PT Frequency: 5 OT Frequency: 5            Contractures Contractures Info: Not present    Additional Factors Info  Code Status, Allergies Code Status Info: Full Code  Allergies Info: NKA           Current Medications (02/20/2018):  This is the current hospital active medication list No current facility-administered medications for this encounter.     Current Outpatient Medications  Medication Sig Dispense Refill  . amLODipine (NORVASC) 5 MG tablet Take 1 tablet (5 mg total) by mouth daily. 30 tablet 0     Discharge Medications: Please see discharge summary for a list of discharge medications.  Relevant Imaging Results:  Relevant Lab Results:   Additional Information SSN [id number]  Robin Perkins  Robin Perkins, [place]

## 2018-02-20 NOTE — ED Notes (Signed)
Pt incontinent of urine, pt cleaned and clean brief applied.the patient repositioned for comfort.

## 2018-02-20 NOTE — ED Triage Notes (Signed)
Pt comes into the ED via EMS from home with c/o increased generalized weakness over the past 2 weeks, states she was able to ambulate with a walker but in now unable to ambulate. Pt c/o tingling burning in her feet.

## 2018-02-20 NOTE — Evaluation (Signed)
Physical Therapy Evaluation Patient Details Name: Robin Perkins MRN: [id number] DOB: 1966/02/23 Today's Date: 02/20/2018   History of Present Illness  Patient is 52 y.o. 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  The patient reports that for the past week, she has become less and less mobile and is now no longer able to ambulate or perform any of her ADLs.  She also reports chronically worsening bilateral foot pain, worse on the right.  No trauma. She has in-home physical therapy, home health, and an uncle who helps her to use the bathroom, but she is unable to take care of herself anymore.    Clinical Impression  Patient A&Ox4 at start of session, family at bedside. Patient HOH and slow to respond to questions, but able to answer with extended time, family verifies given information. Patient reports decrease in independence/function since June. Unable to ambulate or perform ADLs without assistance. Patient demonstrated bed mobility with supervision, but extended time needed and extensive use of bed rails. In seated position patient exhibited shakiness/unsteadiness and needed b/l UE support to maintain seated position.HR at EOB >125, HR 112 to mod Ax1 to scoot to EOB, maxAx1 with RW to stand, ~5secs. Patient fatigued, and anxious during mobility. The patient would benefit from further skilled PT to address changes from PLOF, to maximize independence and improve mobility.    Follow Up Recommendations SNF    Equipment Recommendations  None recommended by PT    Recommendations for Other Services       Precautions / Restrictions Precautions Precautions: Fall      Mobility  Bed Mobility Overal bed mobility: Needs Assistance Bed Mobility: Supine to Sit     Supine to sit: Supervision     General bed mobility comments: Patient heavily uses bed rails and needs extended time to perform but able  to perform without assistance. UE propping b/l to maintain seated position  Transfers Overall transfer level: Needs assistance Equipment used: Rolling walker (2 wheeled) Transfers: Sit to/from Stand Sit to Stand: Max assist         General transfer comment: MaxAx1 and RW to maintain standing position  Ambulation/Gait             General Gait Details: Deferred due to safety concerns, HR > 125  Stairs            Wheelchair Mobility    Modified Rankin (Stroke Patients Only)       Balance Overall balance assessment: Needs assistance Sitting-balance support: Bilateral upper extremity supported;Feet unsupported Sitting balance-Leahy Scale: Poor Sitting balance - Comments: Patient demonstrates shaking/complaints of fatigue while sitting EOB     Standing balance-Leahy Scale: Zero Standing balance comment: with RW                             Pertinent Vitals/Pain Pain Assessment: Faces Faces Pain Scale: Hurts even more Pain Location: b/l foot pain Pain Intervention(s): Monitored during session;Repositioned    Home Living Family/patient expects to be discharged to:: Private residence Living Arrangements: Other relatives(lives with uncle, daughter ) Available Help at Discharge: Family Type of Home: House Home Access: Stairs to enter Entrance Stairs-Rails: None Entrance Stairs-Number of Steps: 2 threshold steps Home Layout: Two level;Bed/bath upstairs Home Equipment: Walker - 2 wheels;Walker - 4 wheels;Shower seat      Prior Function  Comments: In the last month patient reports not being able to ambulate in home or perform ADLS independently     Hand Dominance   Dominant Hand: Left    Extremity/Trunk Assessment   Upper Extremity Assessment Upper Extremity Assessment: Generalized weakness;Defer to OT evaluation    Lower Extremity Assessment Lower Extremity Assessment: Generalized weakness;RLE deficits/detail;LLE  deficits/detail RLE Deficits / Details: 3/5 LLE Deficits / Details: 3/5       Communication   Communication: Other (comment)(slow to respond)  Cognition Arousal/Alertness: Awake/alert Behavior During Therapy: Flat affect                                          General Comments      Exercises     Assessment/Plan    PT Assessment Patient needs continued PT services  PT Problem List Decreased strength;Decreased range of motion;Decreased activity tolerance;Decreased mobility;Decreased balance;Decreased coordination;Decreased cognition;Decreased knowledge of use of DME;Decreased safety awareness;Pain       PT Treatment Interventions DME instruction;Gait training;Stair training;Therapeutic activities;Functional mobility training;Therapeutic exercise;Balance training;Neuromuscular re-education;Patient/family education    PT Goals (Current goals can be found in the Care Plan section)  Acute Rehab PT Goals Patient Stated Goal: Patient wants to return to PLOF PT Goal Formulation: With patient Time For Goal Achievement: 03/06/18 Potential to Achieve Goals: Fair    Frequency Min 2X/week   Barriers to discharge        Co-evaluation               AM-PAC PT "6 Clicks" Daily Activity  Outcome Measure Difficulty turning over in bed (including adjusting bedclothes, sheets and blankets)?: A Lot Difficulty moving from lying on back to sitting on the side of the bed? : A Lot Difficulty sitting down on and standing up from a chair with arms (e.g., wheelchair, bedside commode, etc,.)?: Unable Help needed moving to and from a bed to chair (including a wheelchair)?: A Lot Help needed walking in hospital room?: Total Help needed climbing 3-5 steps with a railing? : Total 6 Click Score: 9    End of Session Equipment Utilized During Treatment: Gait belt Activity Tolerance: Patient limited by fatigue;Patient limited by pain Patient left: in bed;with family/visitor  present;with call bell/phone within reach Nurse Communication: Mobility status PT Visit Diagnosis: Unsteadiness on feet (R26.81);Other abnormalities of gait and mobility (R26.89);Repeated falls (R29.6);Muscle weakness (generalized) (M62.81);History of falling (Z91.81);Difficulty in walking, not elsewhere classified (R26.2)    Time: [id number] PT Time Calculation (min) (ACUTE ONLY): 24 min   Charges:   PT Evaluation $PT Eval Low Complexity: 1 Low PT Treatments $Therapeutic Activity: 8-22 mins   PT G Codes:        Olga Coasteriana Sherial Ebrahim PT, DPT 4:40 PM,02/20/18 [id number]

## 2018-02-20 NOTE — ED Notes (Addendum)
Nystagmus present, but present since birth, per patient.  Pt had tumor removed from face in January. States symptoms have been present since them. Tumor was benign and contained to face.

## 2018-02-20 NOTE — ED Notes (Signed)
Pt waiting for ems to transport to facility.

## 2018-08-30 IMAGING — CT CT HEAD W/O CM
3 series · 15 of 45 positions shown, 18 images · non-contrast
Comparison: CT neck May 30, 2017

CLINICAL DATA: Altered mental status. Previous facial carcinoma.
Headache.

EXAM:
CT HEAD WITHOUT CONTRAST
TECHNIQUE: Contiguous axial images were obtained from the base of the skull
through the vertex without intravenous contrast.

[Series 3: head wo · axial · 0.42mm/px · z∈[-134,-19]mm · 9 of 28 slices shown, 12 images]
[im 3/28  brain]
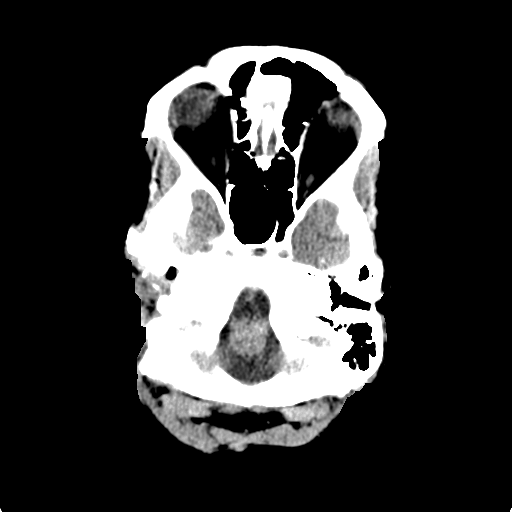
[im 3/28  bone]
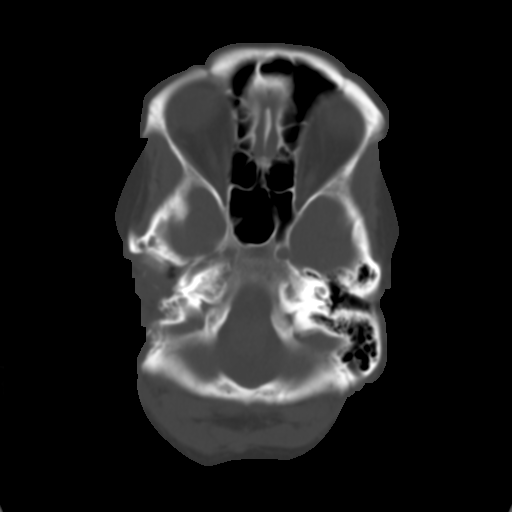
[im 6/28  brain]
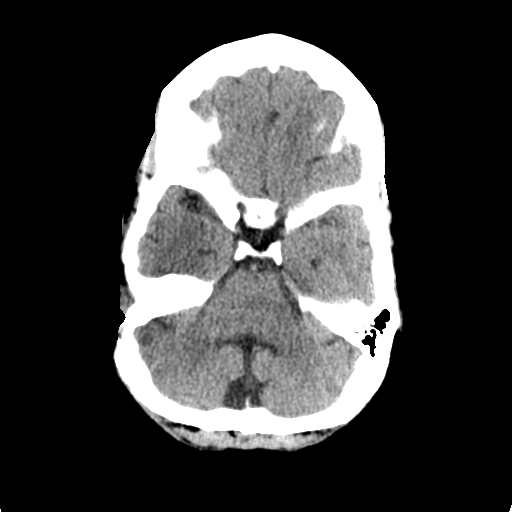
[im 9/28  brain]
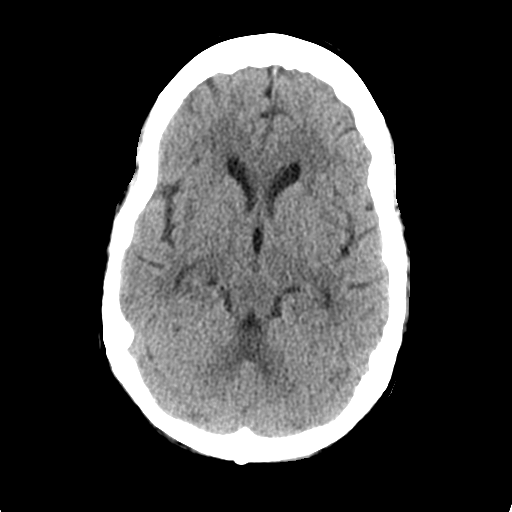
[im 12/28  brain]
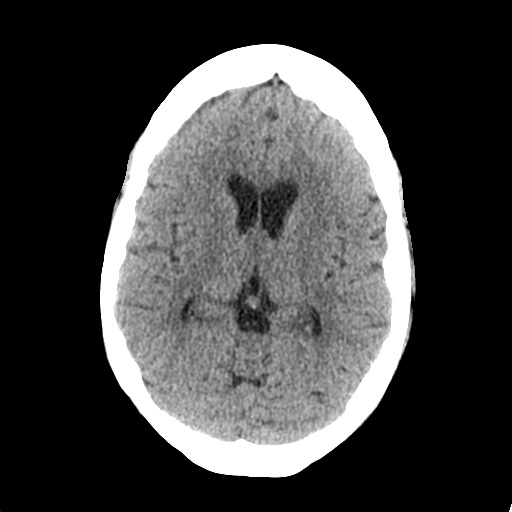
[im 15/28  brain]
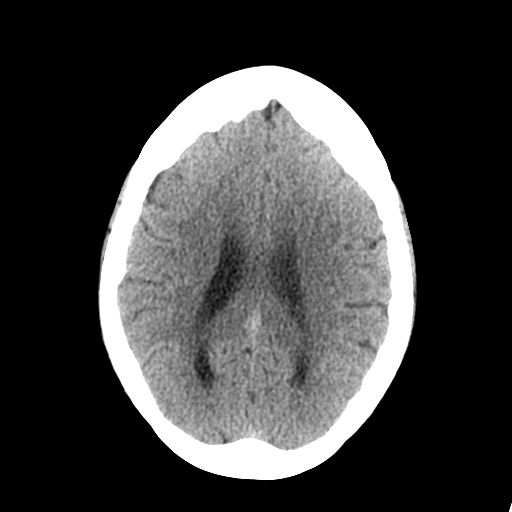
[im 15/28  bone]
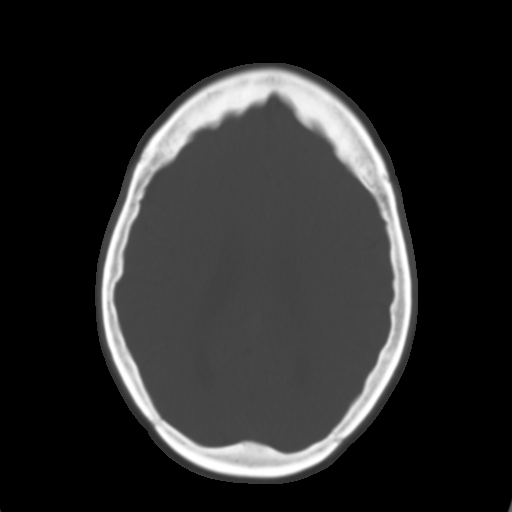
[im 17/28  brain]
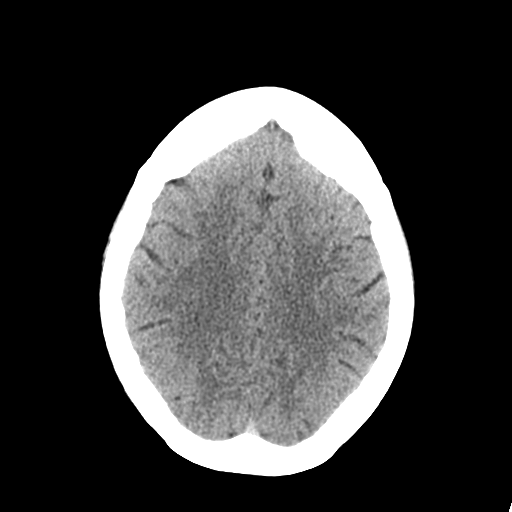
[im 20/28  brain]
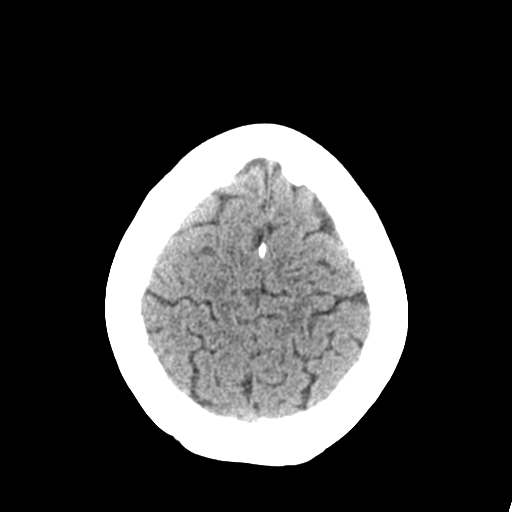
[im 23/28  brain]
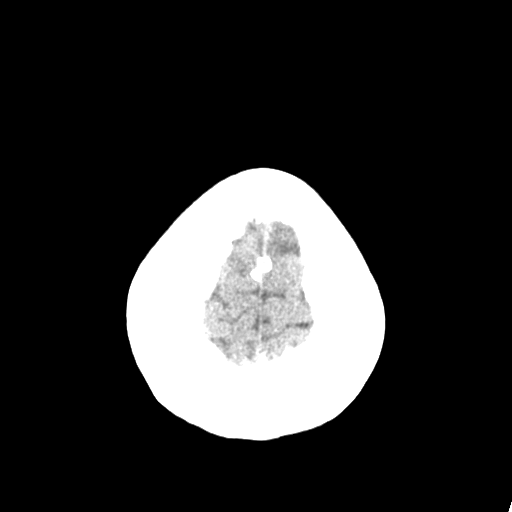
[im 26/28  brain]
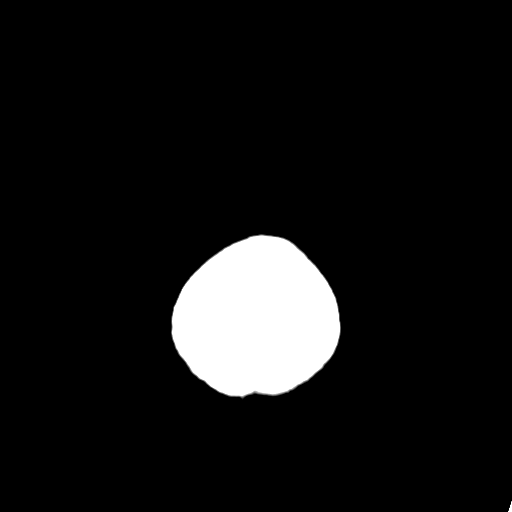
[im 26/28  bone]
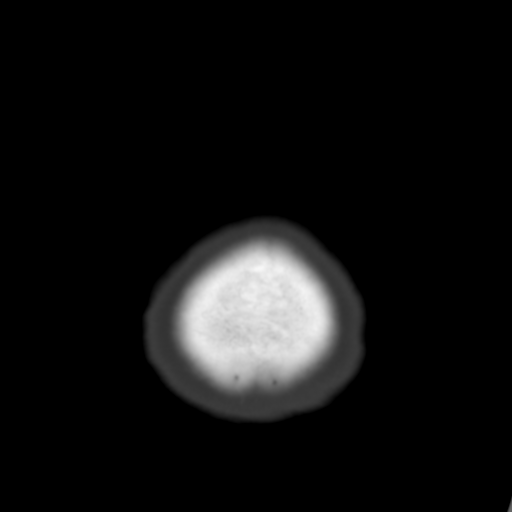

[Series 4: coronal soft tissue · coronal · 0.31mm/px · 3 of 64 slices shown]
[im 22/64  brain]
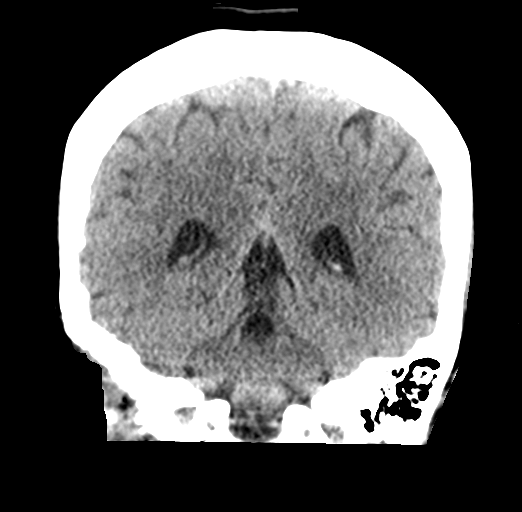
[im 29/64  brain]
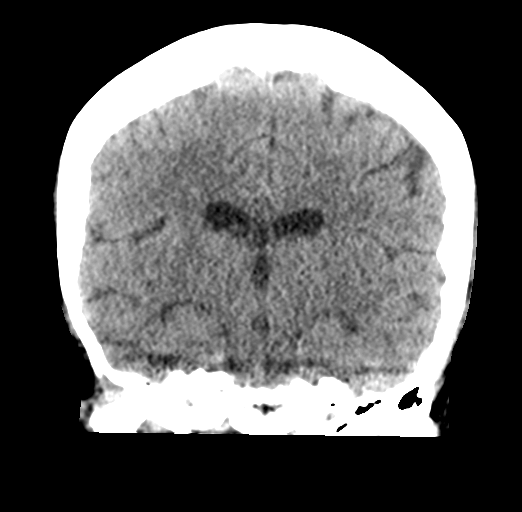
[im 36/64  brain]
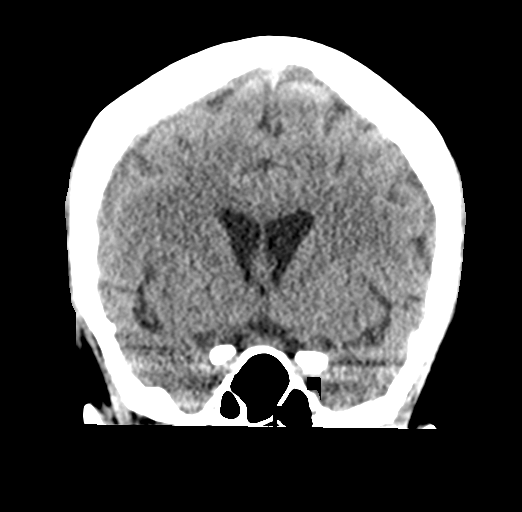

[Series 5: sagittal soft tissue · sagittal · 0.31mm/px · 3 of 48 slices shown]
[im 16/48  brain]
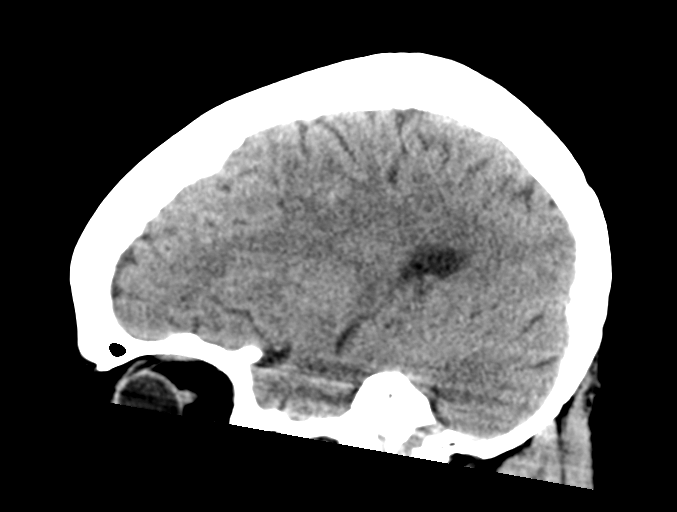
[im 24/48  brain]
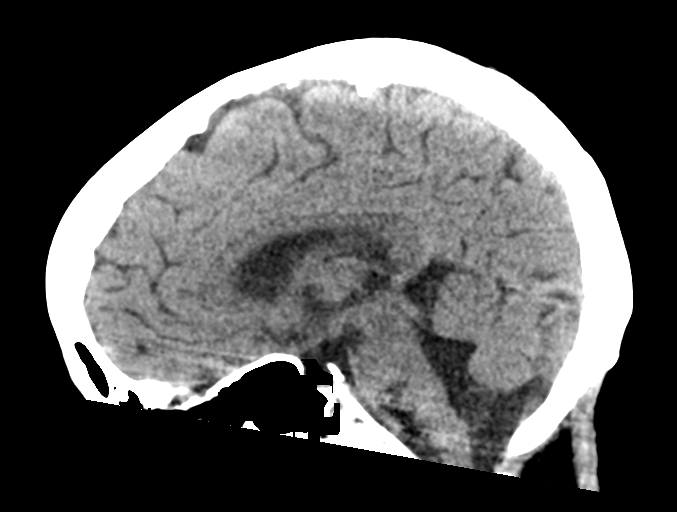
[im 32/48  brain]
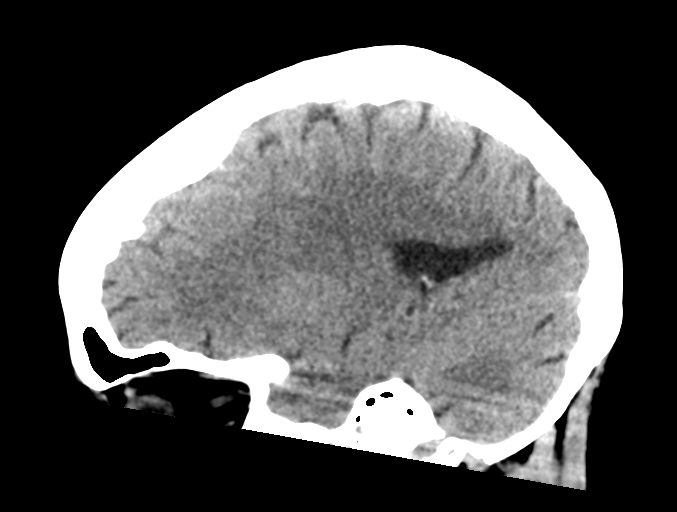

[15 of 45 positions shown; findings below may reference images not displayed]

FINDINGS: Brain: The third and fourth ventricles are normal in size and
configuration. Fourth ventricle is mildly enlarged. There is
cerebellar atrophy. There is no intracranial mass, hemorrhage,
extra-axial fluid collection, or midline shift. Gray-white
compartments appear normal. No evident acute infarct.

Vascular: No hyperdense vessel. No appreciable vascular
calcification.

Skull: Postoperative changes noted on the right with previous
mastoidectomy and removal of mass on the right. There is bony
thinning throughout the right temporal region. No overt bony
destruction evident. Bony calvarium elsewhere appears normal.

Sinuses/Orbits: Visualized paranasal sinuses are clear. Visualized
orbits appear symmetric bilaterally.

Other: Visualized mastoid air cells on the left. Extensive
postoperative changes in the right mastoid region with prior
mastoidectomy and tumor removal.
IMPRESSION: 1. Extensive postoperative changes in the right mastoid region with
evidence of previous mass from this area. Status post mastoidectomy
with areas of bony remodeling and erosive change, likely from the
previous mass.

2. Cerebellar atrophy and fourth ventricle prominence, potentially
due to previous radiation therapy change.

3. Lateral and third ventricles appear normal. No mass or
hemorrhage. The gray-white compartments appear normal.

## 2019-02-19 IMAGING — US US BREAST*L* LIMITED INC AXILLA
1 series · 7 of 7 positions shown · non-contrast
Comparison: None.

CLINICAL DATA: Patient had an outside CT which showed an asymmetric
density in the left breast. Patient states that she has had
fibroadenomas removed from both breast.

EXAM:
2D DIGITAL DIAGNOSTIC BILATERAL MAMMOGRAM WITH CAD AND ADJUNCT TOMO
ULTRASOUND LEFT BREAST

[Series 1: us breast*left* limited inc axilla · 0.07mm/px · 7 of 7 slices shown]
[im 1/7]
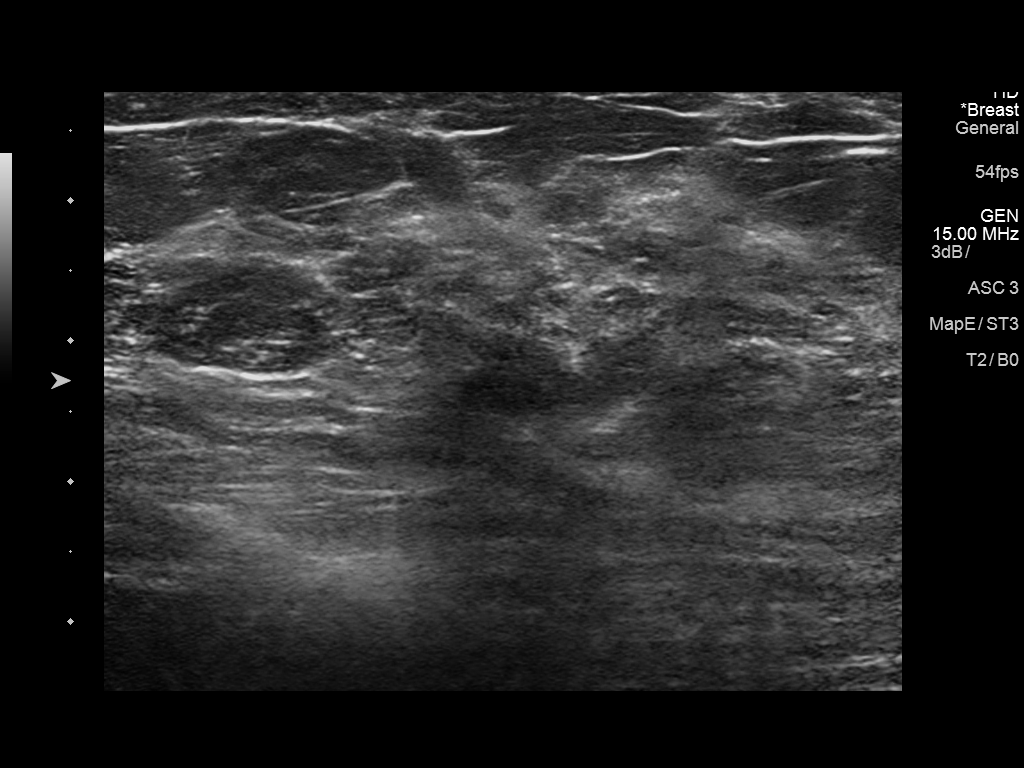
[im 2/7]
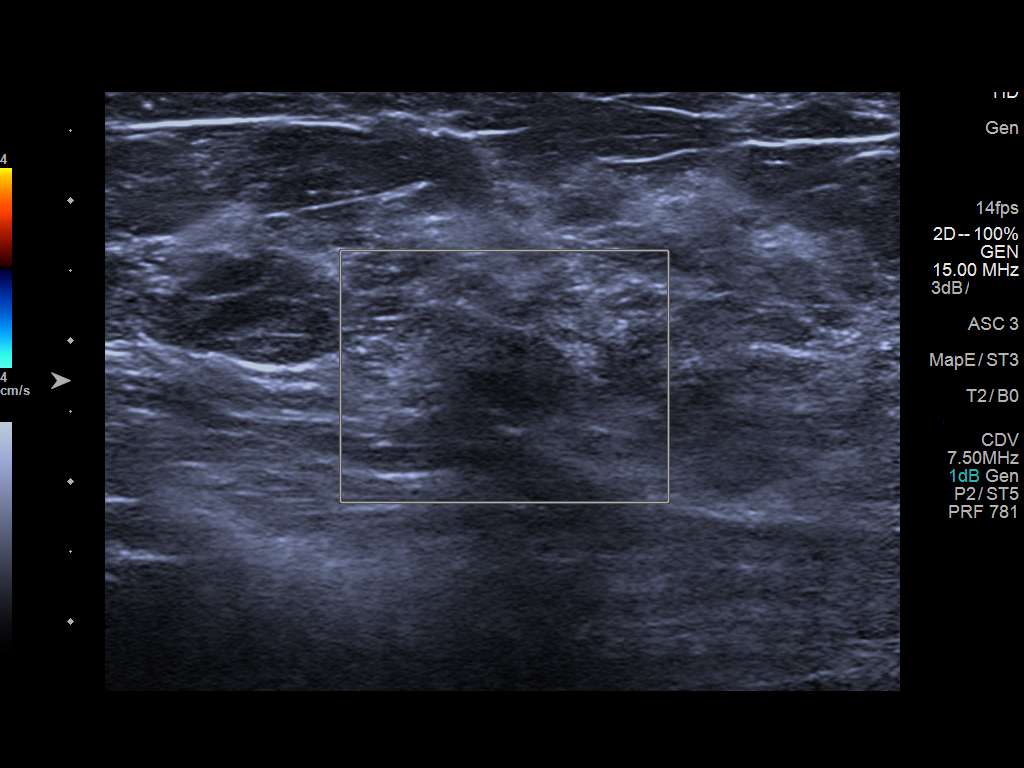
[im 3/7]
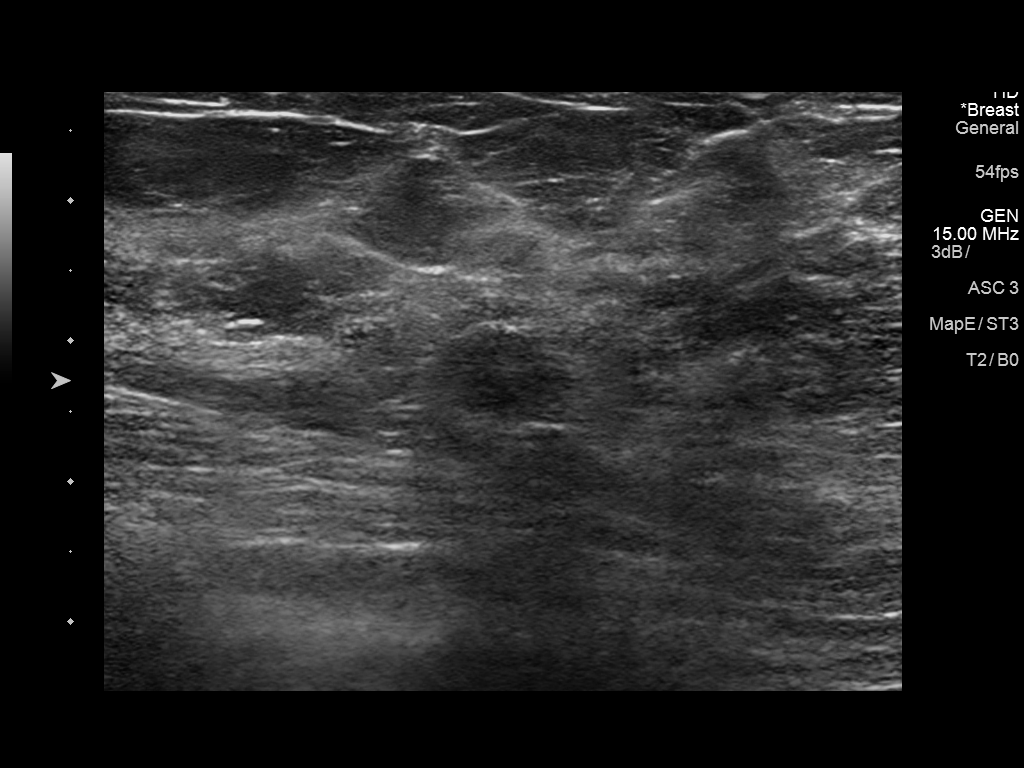
[im 4/7]
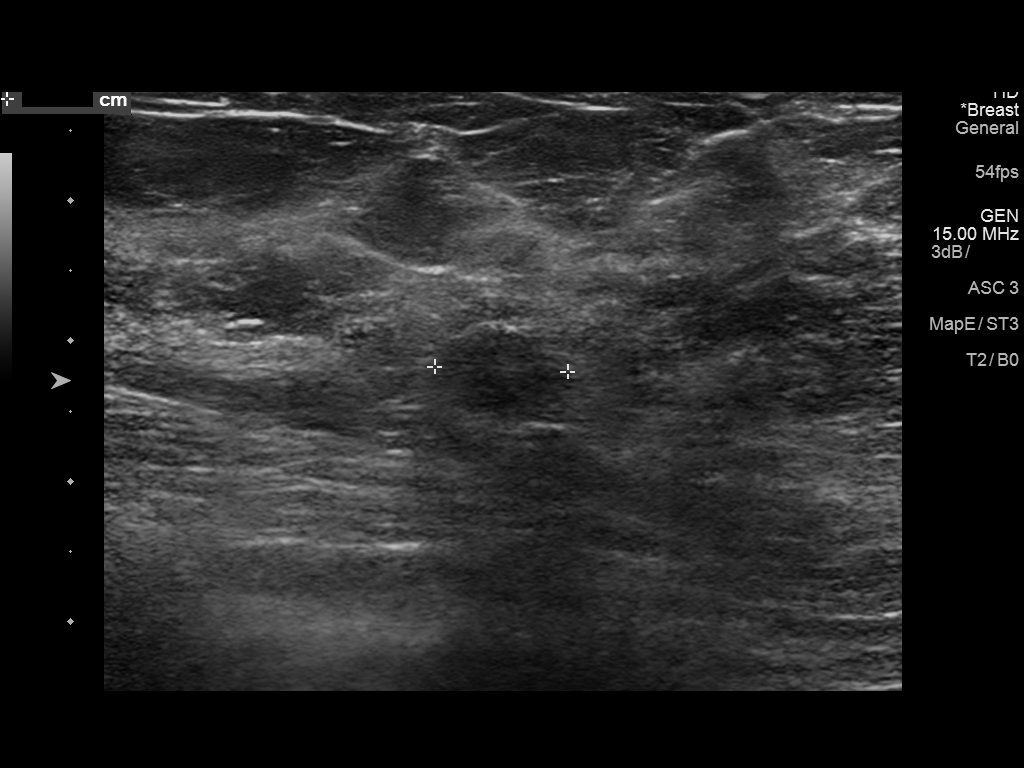
[im 5/7]
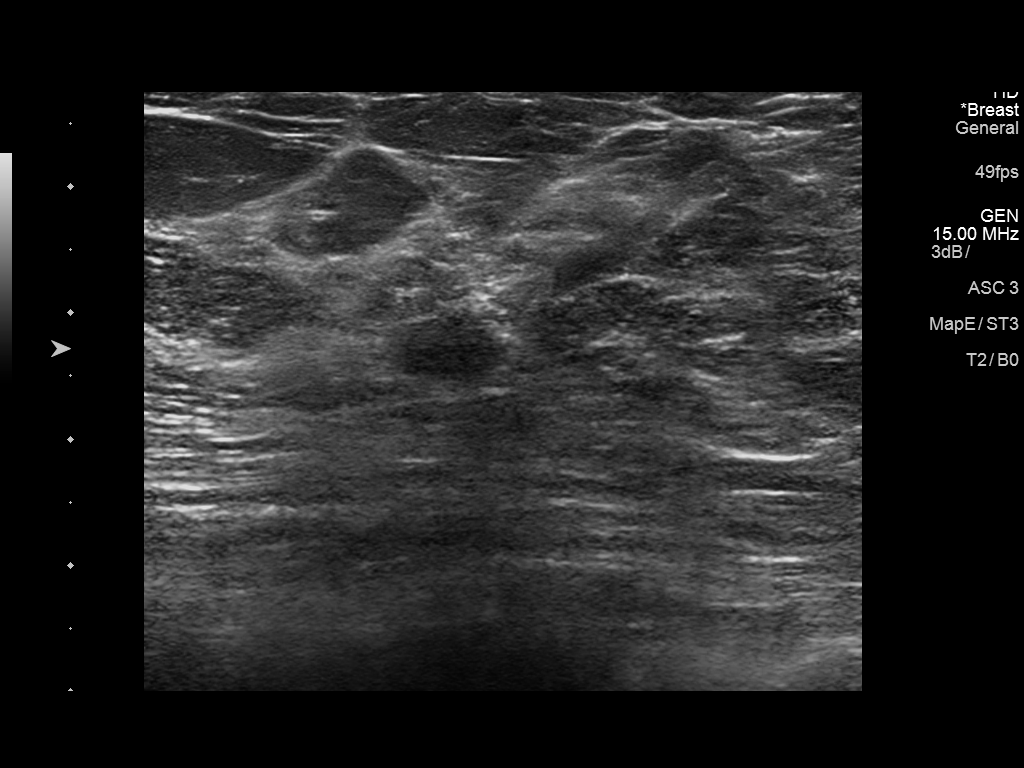
[im 6/7]
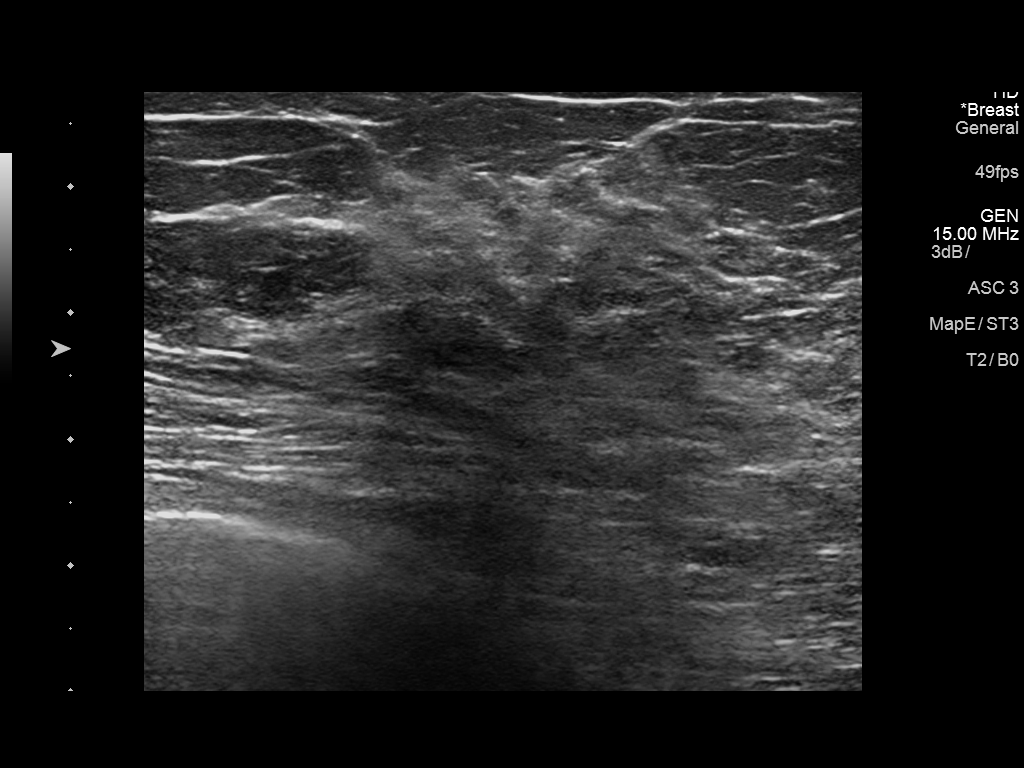
[im 7/7]
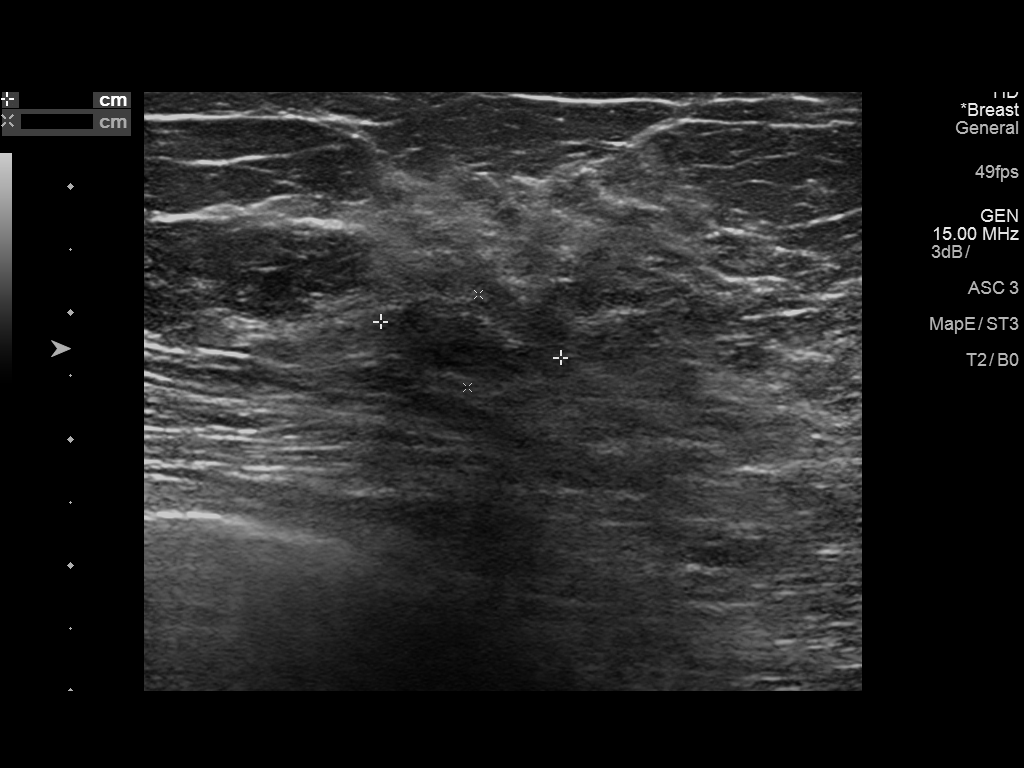

[7 of 7 positions shown; findings below may reference images not displayed]

ACR Breast Density Category b: There are scattered areas of
fibroglandular density.
FINDINGS: No suspicious mass or malignant type microcalcifications identified
in the right breast.

In the upper-inner quadrant of the left breast is a 1.4 cm ovoid
mass with benign appearing dystrophic calcifications. No additional
masses or malignant type microcalcifications identified in left
breast.

Mammographic images were processed with CAD.

On physical exam, I do not palpate a discrete mass in the
upper-inner quadrant of the left breast.

Targeted ultrasound is performed, showing a well-circumscribed
hypoechoic mass in the left breast at 10 o'clock 4 cm from the
nipple measuring 1.5 x 0.7 x 1.0 cm. Echogenic foci corresponding
with the calcifications seen mammographically are seen in the mass.
IMPRESSION: Probable benign fibroadenoma in the left breast.

RECOMMENDATION:
Short-term interval follow-up left mammogram and ultrasound in 6
months is recommended. Option of ultrasound-guided core biopsy was
also discussed with the patient.

I have discussed the findings and recommendations with the patient.
Results were also provided in writing at the conclusion of the
visit. If applicable, a reminder letter will be sent to the patient
regarding the next appointment.

BI-RADS CATEGORY  3: Probably benign.
